# Patient Record
Sex: Female | Born: 1975 | Race: Black or African American | Hispanic: No | Marital: Single | Smoking: Never smoker
Health system: Southern US, Community
[De-identification: ages and names within clinical notes are randomized; demographics above are authoritative.]

## PROBLEM LIST (undated history)

## (undated) DIAGNOSIS — J45909 Unspecified asthma, uncomplicated: Secondary | ICD-10-CM

## (undated) DIAGNOSIS — I10 Essential (primary) hypertension: Secondary | ICD-10-CM

## (undated) HISTORY — PX: OTHER SURGICAL HISTORY: SHX169

## (undated) HISTORY — DX: Essential (primary) hypertension: I10

---

## 2000-07-21 ENCOUNTER — Other Ambulatory Visit: Admission: RE | Admit: 2000-07-21 | Discharge: 2000-07-21 | Payer: Self-pay | Admitting: Obstetrics and Gynecology

## 2001-09-08 ENCOUNTER — Other Ambulatory Visit: Admission: RE | Admit: 2001-09-08 | Discharge: 2001-09-08 | Payer: Self-pay | Admitting: Obstetrics and Gynecology

## 2014-02-03 ENCOUNTER — Ambulatory Visit: Payer: BC Managed Care – PPO

## 2014-02-03 ENCOUNTER — Ambulatory Visit (INDEPENDENT_AMBULATORY_CARE_PROVIDER_SITE_OTHER): Payer: BC Managed Care – PPO | Admitting: Internal Medicine

## 2014-02-03 VITALS — BP 152/104 | HR 52 | Temp 98.0°F | Resp 18 | Wt 211.0 lb

## 2014-02-03 DIAGNOSIS — I10 Essential (primary) hypertension: Secondary | ICD-10-CM

## 2014-02-03 DIAGNOSIS — R5383 Other fatigue: Secondary | ICD-10-CM

## 2014-02-03 DIAGNOSIS — R011 Cardiac murmur, unspecified: Secondary | ICD-10-CM

## 2014-02-03 DIAGNOSIS — R002 Palpitations: Secondary | ICD-10-CM

## 2014-02-03 DIAGNOSIS — R0602 Shortness of breath: Secondary | ICD-10-CM

## 2014-02-03 DIAGNOSIS — Z823 Family history of stroke: Secondary | ICD-10-CM

## 2014-02-03 DIAGNOSIS — Z8249 Family history of ischemic heart disease and other diseases of the circulatory system: Secondary | ICD-10-CM

## 2014-02-03 DIAGNOSIS — R5381 Other malaise: Secondary | ICD-10-CM

## 2014-02-03 DIAGNOSIS — R51 Headache: Secondary | ICD-10-CM

## 2014-02-03 DIAGNOSIS — R079 Chest pain, unspecified: Secondary | ICD-10-CM

## 2014-02-03 LAB — POCT CBC
GRANULOCYTE PERCENT: 48.8 % (ref 37–80)
HEMATOCRIT: 41.1 % (ref 37.7–47.9)
HEMOGLOBIN: 13.1 g/dL (ref 12.2–16.2)
Lymph, poc: 1.7 (ref 0.6–3.4)
MCH, POC: 28.6 pg (ref 27–31.2)
MCHC: 31.9 g/dL (ref 31.8–35.4)
MCV: 89.8 fL (ref 80–97)
MID (cbc): 0.3 (ref 0–0.9)
MPV: 9.7 fL (ref 0–99.8)
POC Granulocyte: 2 (ref 2–6.9)
POC LYMPH %: 43 % (ref 10–50)
POC MID %: 8.2 %M (ref 0–12)
Platelet Count, POC: 267 10*3/uL (ref 142–424)
RBC: 4.58 M/uL (ref 4.04–5.48)
RDW, POC: 15.3 %
WBC: 4 10*3/uL — AB (ref 4.6–10.2)

## 2014-02-03 LAB — POCT URINE PREGNANCY: Preg Test, Ur: NEGATIVE

## 2014-02-03 LAB — COMPREHENSIVE METABOLIC PANEL
ALT: 20 U/L (ref 0–35)
AST: 20 U/L (ref 0–37)
Albumin: 4.2 g/dL (ref 3.5–5.2)
Alkaline Phosphatase: 60 U/L (ref 39–117)
BILIRUBIN TOTAL: 0.8 mg/dL (ref 0.2–1.2)
BUN: 15 mg/dL (ref 6–23)
CO2: 28 mEq/L (ref 19–32)
CREATININE: 0.86 mg/dL (ref 0.50–1.10)
Calcium: 9.3 mg/dL (ref 8.4–10.5)
Chloride: 104 mEq/L (ref 96–112)
Glucose, Bld: 79 mg/dL (ref 70–99)
Potassium: 4 mEq/L (ref 3.5–5.3)
SODIUM: 139 meq/L (ref 135–145)
Total Protein: 7.2 g/dL (ref 6.0–8.3)

## 2014-02-03 LAB — POCT URINALYSIS DIPSTICK
BILIRUBIN UA: NEGATIVE
Blood, UA: NEGATIVE
Glucose, UA: NEGATIVE
KETONES UA: NEGATIVE
Leukocytes, UA: NEGATIVE
Nitrite, UA: NEGATIVE
PH UA: 6.5
Protein, UA: NEGATIVE
SPEC GRAV UA: 1.025
Urobilinogen, UA: 0.2

## 2014-02-03 LAB — POCT UA - MICROSCOPIC ONLY
BACTERIA, U MICROSCOPIC: NEGATIVE
Casts, Ur, LPF, POC: NEGATIVE
Crystals, Ur, HPF, POC: NEGATIVE
MUCUS UA: POSITIVE
Yeast, UA: NEGATIVE

## 2014-02-03 LAB — LIPID PANEL
Cholesterol: 120 mg/dL (ref 0–200)
HDL: 73 mg/dL (ref >39)
LDL Cholesterol: 37 mg/dL (ref 0–99)
TRIGLYCERIDES: 50 mg/dL (ref <150)
Total CHOL/HDL Ratio: 1.6 Ratio
VLDL: 10 mg/dL (ref 0–40)

## 2014-02-03 LAB — TSH: TSH: 1.841 u[IU]/mL (ref 0.350–4.500)

## 2014-02-03 MED ORDER — LISINOPRIL 10 MG PO TABS: 10.0000 mg | ORAL_TABLET | Freq: Every day | ORAL | Status: DC

## 2014-02-03 MED ORDER — METHOCARBAMOL 750 MG PO TABS: 750.0000 mg | ORAL_TABLET | Freq: Four times a day (QID) | ORAL | Status: DC

## 2014-02-03 NOTE — Patient Instructions (Addendum)
DASH Diet The DASH diet stands for "Dietary Approaches to Stop Hypertension." It is a healthy eating plan that has been shown to reduce high blood pressure (hypertension) in as little as 14 days, while also possibly providing other significant health benefits. These other health benefits include reducing the risk of breast cancer after menopause and reducing the risk of type 2 diabetes, heart disease, colon cancer, and stroke. Health benefits also include weight loss and slowing kidney failure in patients with chronic kidney disease.  DIET GUIDELINES  Limit salt (sodium). Your diet should contain less than 1500 mg of sodium daily.  Limit refined or processed carbohydrates. Your diet should include mostly whole grains. Desserts and added sugars should be used sparingly.  Include small amounts of heart-healthy fats. These types of fats include nuts, oils, and tub margarine. Limit saturated and trans fats. These fats have been shown to be harmful in the body. CHOOSING FOODS  The following food groups are based on a 2000 calorie diet. See your Registered Dietitian for individual calorie needs. Grains and Grain Products (6 to 8 servings daily)  Eat More Often: Whole-wheat bread, brown rice, whole-grain or wheat pasta, quinoa, popcorn without added fat or salt (air popped).  Eat Less Often: White bread, white pasta, white rice, cornbread. Vegetables (4 to 5 servings daily)  Eat More Often: Fresh, frozen, and canned vegetables. Vegetables may be raw, steamed, roasted, or grilled with a minimal amount of fat.  Eat Less Often/Avoid: Creamed or fried vegetables. Vegetables in a cheese sauce. Fruit (4 to 5 servings daily)  Eat More Often: All fresh, canned (in natural juice), or frozen fruits. Dried fruits without added sugar. One hundred percent fruit juice ( cup [237 mL] daily).  Eat Less Often: Dried fruits with added sugar. Canned fruit in light or heavy syrup. Lean Meats, Fish, and Poultry (2  servings or less daily. One serving is 3 to 4 oz [85-114 g]).  Eat More Often: Ninety percent or leaner ground beef, tenderloin, sirloin. Round cuts of beef, chicken breast, turkey breast. All fish. Grill, bake, or broil your meat. Nothing should be fried.  Eat Less Often/Avoid: Fatty cuts of meat, turkey, or chicken leg, thigh, or wing. Fried cuts of meat or fish. Dairy (2 to 3 servings)  Eat More Often: Low-fat or fat-free milk, low-fat plain or light yogurt, reduced-fat or part-skim cheese.  Eat Less Often/Avoid: Milk (whole, 2%).Whole milk yogurt. Full-fat cheeses. Nuts, Seeds, and Legumes (4 to 5 servings per week)  Eat More Often: All without added salt.  Eat Less Often/Avoid: Salted nuts and seeds, canned beans with added salt. Fats and Sweets (limited)  Eat More Often: Vegetable oils, tub margarines without trans fats, sugar-free gelatin. Mayonnaise and salad dressings.  Eat Less Often/Avoid: Coconut oils, palm oils, butter, stick margarine, cream, half and half, cookies, candy, pie. FOR MORE INFORMATION The Dash Diet Eating Plan: www.dashdiet.org Document Released: 09/19/2011 Document Revised: 12/23/2011 Document Reviewed: 09/19/2011 [employer] Patient Information 2014 [employer]. Hypertension As your heart beats, it forces blood through your arteries. This force is your blood pressure. If the pressure is too high, it is called hypertension (HTN) or high blood pressure. HTN is dangerous because you may have it and not know it. High blood pressure may mean that your heart has to work harder to pump blood. Your arteries may be narrow or stiff. The extra work puts you at risk for heart disease, stroke, and other problems.  Blood pressure consists of two numbers, a   higher number over a lower, 110/72, for example. It is stated as "110 over 72." The ideal is below 120 for the top number (systolic) and under 80 for the bottom (diastolic). Write down your blood pressure today. You  should pay close attention to your blood pressure if you have certain conditions such as:  Heart failure.  Prior heart attack.  Diabetes  Chronic kidney disease.  Prior stroke.  Multiple risk factors for heart disease. To see if you have HTN, your blood pressure should be measured while you are seated with your arm held at the level of the heart. It should be measured at least twice. A one-time elevated blood pressure reading (especially in the Emergency Department) does not mean that you need treatment. There may be conditions in which the blood pressure is different between your right and left arms. It is important to see your caregiver soon for a recheck. Most people have essential hypertension which means that there is not a specific cause. This type of high blood pressure may be lowered by changing lifestyle factors such as:  Stress.  Smoking.  Lack of exercise.  Excessive weight.  Drug/tobacco/alcohol use.  Eating less salt. Most people do not have symptoms from high blood pressure until it has caused damage to the body. Effective treatment can often prevent, delay or reduce that damage. TREATMENT  When a cause has been identified, treatment for high blood pressure is directed at the cause. There are a large number of medications to treat HTN. These fall into several categories, and your caregiver will help you select the medicines that are best for you. Medications may have side effects. You should review side effects with your caregiver. If your blood pressure stays high after you have made lifestyle changes or started on medicines,   Your medication(s) may need to be changed.  Other problems may need to be addressed.  Be certain you understand your prescriptions, and know how and when to take your medicine.  Be sure to follow up with your caregiver within the time frame advised (usually within two weeks) to have your blood pressure rechecked and to review your  medications.  If you are taking more than one medicine to lower your blood pressure, make sure you know how and at what times they should be taken. Taking two medicines at the same time can result in blood pressure that is too low. SEEK IMMEDIATE MEDICAL CARE IF:  You develop a severe headache, blurred or changing vision, or confusion.  You have unusual weakness or numbness, or a faint feeling.  You have severe chest or abdominal pain, vomiting, or breathing problems. MAKE SURE YOU:   Understand these instructions.  Will watch your condition.  Will get help right away if you are not doing well or get worse. Document Released: 09/30/2005 Document Revised: 12/23/2011 Document Reviewed: 05/20/2008 [facility] Patient Information 2014 [place], [place]. Thoracic Strain You have injured the muscles or tendons that attach to the upper part of your back behind your chest. This injury is called a thoracic strain, thoracic sprain, or mid-back strain.  CAUSES  The cause of thoracic strain varies. A less severe injury involves pulling a muscle or tendon without tearing it. A more severe injury involves tearing (rupturing) a muscle or tendon. With less severe injuries, there may be little loss of strength. Sometimes, there are breaks (fractures) in the bones to which the muscles are attached. These fractures are rare, unless there was a direct hit (trauma) or  you have weak bones due to osteoporosis or age. Longstanding strains may be caused by overuse or improper form during certain movements. Obesity can also increase your risk for back injuries. Sudden strains may occur due to injury or not warming up properly before exercise. Often, there is no obvious cause for a thoracic strain. SYMPTOMS  The main symptom is pain, especially with movement, such as during exercise. DIAGNOSIS  Your caregiver can usually tell what is wrong by taking an X-ray and doing a physical exam. TREATMENT   Physical therapy may  be helpful for recovery. Your caregiver can give you exercises to do or refer you to a physical therapist after your pain improves.  After your pain improves, strengthening and conditioning programs appropriate for your sport or occupation may be helpful.  Always warm up before physical activities or athletics. Stretching after physical activity may also help.  Certain over-the-counter medicines may also help. Ask your caregiver if there are medicines that would help you. If this is your first thoracic strain injury, proper care and proper healing time before starting activities should prevent long-term problems. Torn ligaments and tendons require as long to heal as broken bones. Average healing times may be only 1 week for a mild strain. For torn muscles and tendons, healing time may be up to 6 weeks to 2 months. HOME CARE INSTRUCTIONS   Apply ice to the injured area. Ice massages may also be used as directed.  Put ice in a plastic bag.  Place a towel between your skin and the bag.  Leave the ice on for 15-20 minutes, 03-04 times a day, for the first 2 days.  Only take over-the-counter or prescription medicines for pain, discomfort, or fever as directed by your caregiver.  Keep your appointments for physical therapy if this was prescribed.  Use wraps and back braces as instructed. SEEK IMMEDIATE MEDICAL CARE IF:   You have an increase in bruising, swelling, or pain.  Your pain has not improved with medicines.  You develop new shortness of breath, chest pain, or fever.  Problems seem to be getting worse rather than better. MAKE SURE YOU:   Understand these instructions.  Will watch your condition.  Will get help right away if you are not doing well or get worse. Document Released: 12/21/2003 Document Revised: 12/23/2011 Document Reviewed: 11/16/2010 [facility] Patient Information 2014 [place], [place]. Heart Murmur A heart murmur is an extra sound heard by your health care  provider when listening to your heart with a device called a stethoscope. The sound comes from turbulence when blood flows through the heart and may be a "hum" or "whoosh" sound heard when the heart beats. There are two types of heart murmurs:  Innocent murmurs Most people with this type of heart murmur do not have a heart problem. Many children have innocent heart murmurs. Your health care provider may suggest some basic testing to know whether your murmur is an innocent murmur. If an innocent heart murmur is found, there is no need for further tests or treatment and no need to restrict activities or stop playing sports.  Abnormal murmurs These types of murmurs can occur in children and adults. In children, abnormal heart murmurs are typically caused from heart defects that are present at birth (congenital). In adults, abnormal murmurs are usually from heart valve problems caused by disease, infection, or aging. CAUSES  All heart murmurs are a result of an issue with your heart valves. Normally, these valves open to let  blood flow through or out of your heart and then shut to keep it from flowing backward. If they do not work properly, you could have:  Regurgitation When blood leaks back through the valve in the wrong direction.  Mitral valve prolapse When the mitral valve of the heart has a loose flap and does not close tightly.  Stenosis When the valve does not open enough and blocks blood flow. SIGNS AND SYMPTOMS  Innocent murmurs do not cause symptoms, and many people with abnormal murmurs may or may not have symptoms. If symptoms do develop, they may include:  Shortness of breath.  Blue coloring of the skin, especially on the fingertips.  Chest pain.  Palpitations, or feeling a fluttering or skipped heartbeat.  Fainting.  Persistent cough.  Getting tired much faster than expected. DIAGNOSIS  A heart murmur might be heard during a sports physical or during any type of examination.  When a murmur is heard, it may suggest a possible problem. When this happens, your health care provider may ask you to see a heart specialist (cardiologist). You may also be asked to have one or more heart tests. In these cases, testing may vary depending on what your health care provider heard. Tests for a heart murmur may include:  Electrocardiogram.  Echocardiogram.  MRI. For children and adults who have an abnormal heart murmur and want to play sports, it is important to complete testing, review test results, and receive recommendations from your health care provider. If heart disease is present, it may not be safe to play. TREATMENT  Innocent murmurs require no treatment or activity restriction. If an abnormal murmur represents a problem with the heart, treatment will depend on the exact nature of the problem. In these cases, medicine or surgery may be needed to treat the problem. HOME CARE INSTRUCTIONS If you want to participate in sports or other types of strenuous physical activity, it is important to discuss this first with your health care provider. If the murmur represents a problem with the heart and you choose to participate in sports, there is a small chance that a serious problem (including sudden death) could result.  SEEK MEDICAL CARE IF:   You feel that your symptoms are slowly worsening.  You develop any new symptoms that cause concern.  You feel that you are having side effects from any medicines prescribed. SEEK IMMEDIATE MEDICAL CARE IF:   You develop chest pain.  You have shortness of breath.  You notice that your heart beats irregularly often enough to cause you to worry.  You have fainting spells.  Your symptoms suddenly get worse. Document Released: 11/07/2004 Document Revised: 07/21/2013 Document Reviewed: 06/07/2013 [facility] Patient Information 2014 [place], [place].

## 2014-02-03 NOTE — Progress Notes (Signed)
Subjective:    Patient ID: Jessica Moreno, female    DOB: 08/28/1976, 38 y.o.   MRN: [id number]  HPI  Back spasms x this morning, tight,painful, will spasm with movement, bent down to pick up lotion, and could not get back up  Hypertension-  hx of hypertension, HA, nausea, SOB, Palpitations, fatigue. Needs refill on BP medication, does not know the name of the medication she has tried. Hx of heart murmer Denies- blurred vision, vomiting, arm pain, chest pain , numbness, weakness, tingling  First tried a diuretic, but did not help. Has not taken BP meds x 5-6 months.  [place] 155-159/100 the past few days    Family history of heart attack  Family history of stroke  Review of Systems  Constitutional: Positive for fatigue.  Eyes: Negative.   Respiratory: Negative.   Cardiovascular: Positive for palpitations.  Gastrointestinal: Positive for nausea.  Musculoskeletal: Positive for back pain.  Allergic/Immunologic: Negative.   Neurological: Positive for weakness and headaches.  Psychiatric/Behavioral: Negative.        Objective:   Physical Exam  Constitutional: She is oriented to person, place, and time. She appears well-developed and well-nourished. No distress.  HENT:  Head: Normocephalic.  Mouth/Throat: Oropharynx is clear and moist.  Eyes: Conjunctivae and EOM are normal. Pupils are equal, round, and reactive to light.  Neck: Normal range of motion. Neck supple. No thyromegaly present.  Cardiovascular: Normal rate, regular rhythm and intact distal pulses.  Exam reveals no gallop and no friction rub.   Murmur heard. Pulmonary/Chest: Effort normal and breath sounds normal.  Abdominal: Soft. Bowel sounds are normal. There is no tenderness.  Musculoskeletal: She exhibits tenderness.       Thoracic back: She exhibits tenderness, bony tenderness, pain and spasm.  Lymphadenopathy:    She has no cervical adenopathy.  Neurological: She is alert and  oriented to person, place, and time. She exhibits normal muscle tone. Coordination normal.  Psychiatric: She has a normal mood and affect. Her behavior is normal. Judgment and thought content normal.    EKG sinus brady with 1st degree AV block  [facility] reading (PRIMARY) by  Dr Perrin MalteseGuest normal cxr  Results for orders placed in visit on 02/03/14  POCT CBC      Result Value Ref Range   WBC 4.0 (*) 4.6 - 10.2 K/uL   Lymph, poc 1.7  0.6 - 3.4   POC LYMPH PERCENT 43.0  10 - 50 %L   MID (cbc) 0.3  0 - 0.9   POC MID % 8.2  0 - 12 %M   POC Granulocyte 2.0  2 - 6.9   Granulocyte percent 48.8  37 - 80 %G   RBC 4.58  4.04 - 5.48 M/uL   Hemoglobin 13.1  12.2 - 16.2 g/dL   HCT, POC 40.941.1  81.137.7 - 47.9 %   MCV 89.8  80 - 97 fL   MCH, POC 28.6  27 - 31.2 pg   MCHC 31.9  31.8 - 35.4 g/dL   RDW, POC 91.415.3     Platelet Count, POC 267  142 - 424 K/uL   MPV 9.7  0 - 99.8 fL  POCT UA - MICROSCOPIC ONLY      Result Value Ref Range   WBC, Ur, HPF, POC 0-1     RBC, urine, microscopic 0-3     Bacteria, U Microscopic neg     Mucus, UA positive     Epithelial cells, urine per  micros 0-2     Crystals, Ur, HPF, POC neg     Casts, Ur, LPF, POC neg     Yeast, UA neg    POCT URINALYSIS DIPSTICK      Result Value Ref Range   Color, UA yellow     Clarity, UA clear     Glucose, UA neg     Bilirubin, UA neg     Ketones, UA neg     Spec Grav, UA 1.025     Blood, UA neg     pH, UA 6.5     Protein, UA neg     Urobilinogen, UA 0.2     Nitrite, UA neg     Leukocytes, UA Negative    POCT URINE PREGNANCY      Result Value Ref Range   Preg Test, Ur Negative             Assessment & Plan:  Thoracic spasms/Pain HTN uncontrolled 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

## 2014-02-04 ENCOUNTER — Encounter: Payer: Self-pay | Admitting: Family Medicine

## 2014-02-12 ENCOUNTER — Ambulatory Visit (INDEPENDENT_AMBULATORY_CARE_PROVIDER_SITE_OTHER): Payer: BC Managed Care – PPO | Admitting: Internal Medicine

## 2014-02-12 VITALS — BP 138/106 | HR 70 | Temp 97.4°F | Resp 15 | Ht 69.0 in | Wt 212.0 lb

## 2014-02-12 DIAGNOSIS — I10 Essential (primary) hypertension: Secondary | ICD-10-CM | POA: Insufficient documentation

## 2014-02-12 DIAGNOSIS — Z79899 Other long term (current) drug therapy: Secondary | ICD-10-CM

## 2014-02-12 MED ORDER — LISINOPRIL-HYDROCHLOROTHIAZIDE 20-12.5 MG PO TABS: 1.0000 | ORAL_TABLET | Freq: Every day | ORAL | Status: DC

## 2014-02-12 NOTE — Progress Notes (Signed)
   Subjective:    Patient ID: Jessica Moreno, female    DOB: 05/22/1976, 38 y.o.   MRN: [id number]  HPI 38 year old female pt presents for a re-check of hypertension. Pt was seen 4/23 and started on lisinopril 10 mg. She has been monitoring her BP at home and it continues to be high. She takes her BP in the morning and at night. It ranges from 160/106 and 165/106. Continues to have headaches and dizziness. Slight nausea. Slight fatigue. No SOB or chest pain. When pt triaged BP was 138/106. When checked again at 12:50 it was 148/102. Family hx of hypertension.    Review of Systems     Objective:   Physical Exam  Constitutional: She appears well-developed and well-nourished.  HENT:  Head: Normocephalic.  Eyes: EOM are normal.  Neck: Normal range of motion.  Cardiovascular: Normal rate.   Pulmonary/Chest: Effort normal.  Neurological: She is alert. She exhibits normal muscle tone. Coordination normal.  Psychiatric: She has a normal mood and affect.   142/102        Assessment & Plan:  Increase Lisinopril/htz 20/12.5 start today See cardiology

## 2014-02-12 NOTE — Patient Instructions (Signed)
Immunization Schedule, Adult  Influenza vaccine.  All adults should be immunized every year.  All adults, including pregnant women and people with hives-only allergy to eggs can receive the inactivated influenza (IIV) vaccine.  Adults aged 40 49 years can receive the recombinant influenza (RIV) vaccine. The RIV vaccine does not contain any egg protein.  Adults aged 15 years or older can receive the standard-dose IIV or the high-dose IIV.  Tetanus, diphtheria, and acellular pertussis (Td, Tdap) vaccine.  Pregnant women should receive 1 dose of Tdap vaccine during each pregnancy. The dose should be obtained regardless of the length of time since the last dose. Immunization is preferred during the 27th to 36th week of gestation.  An adult who has not previously received Tdap or who does not know his or her vaccine status should receive 1 dose of Tdap. This initial dose should be followed by tetanus and diphtheria toxoids (Td) booster doses every 10 years.  Adults with an unknown or incomplete history of completing a 3-dose immunization series with Td-containing vaccines should begin or complete a primary immunization series including a Tdap dose.  Adults should receive a Td booster every 10 years.  Varicella vaccine.  An adult without evidence of immunity to varicella should receive 2 doses or a second dose if he or she has previously received 1 dose.  Pregnant females who do not have evidence of immunity should receive the first dose after pregnancy. This first dose should be obtained before leaving the health care facility. The second dose should be obtained 4 8 weeks after the first dose.  Human papillomavirus (HPV) vaccine.  Females aged 39 26 years who have not received the vaccine previously should obtain the 3-dose series.  The vaccine is not recommended for use in pregnant females. However, pregnancy testing is not needed before receiving a dose. If a female is found to be  pregnant after receiving a dose, no treatment is needed. In that case, the remaining doses should be delayed until after the pregnancy.  Males aged 60 21 years who have not received the vaccine previously should receive the 3-dose series. Males aged 24 26 years may be immunized.  Immunization is recommended through the age of 4 years for any female who has sex with males and did not get any or all doses earlier.  Immunization is recommended for any person with an immunocompromised condition through the age of 72 years if he or she did not get any or all doses earlier.  During the 3-dose series, the second dose should be obtained 4 8 weeks after the first dose. The third dose should be obtained 24 weeks after the first dose and 16 weeks after the second dose.  Zoster vaccine.  One dose is recommended for adults aged 29 years or older unless certain conditions are present.  Measles, mumps, and rubella (MMR) vaccine.  Adults born before [birth year] generally are considered immune to measles and mumps.  Adults born in [birth year] or later should have 1 or more doses of MMR vaccine unless there is a contraindication to the vaccine or there is laboratory evidence of immunity to each of the three diseases.  A routine second dose of MMR vaccine should be obtained at least 28 days after the first dose for students attending postsecondary schools, health care workers, or international travelers.  People who received inactivated measles vaccine or an unknown type of measles vaccine during 1963 1967 should receive 2 doses of MMR vaccine.  People who received  inactivated mumps vaccine or an unknown type of mumps vaccine before 1979 and are at high risk for mumps infection should consider immunization with 2 doses of MMR vaccine.  For females of childbearing age, rubella immunity should be determined. If there is no evidence of immunity, females who are not pregnant should be vaccinated. If there is no evidence of  immunity, females who are pregnant should delay immunization until after pregnancy.  Unvaccinated health care workers born before [birth year] who lack laboratory evidence of measles, mumps, or rubella immunity or laboratory confirmation of disease should consider measles and mumps immunization with 2 doses of MMR vaccine or rubella immunization with 1 dose of MMR vaccine.  Pneumococcal 13-valent conjugate (PCV13) vaccine.  When indicated, a person who is uncertain of his or her immunization history and has no record of immunization should receive the PCV13 vaccine.  An adult aged 38 years or older who has certain medical conditions and has not been previously immunized should receive 1 dose of PCV13 vaccine. This PCV13 should be followed with a dose of pneumococcal polysaccharide (PPSV23) vaccine. The PPSV23 vaccine dose should be obtained at least 8 weeks after the dose of PCV13 vaccine.  An adult aged 33 years or older who has certain medical conditions and previously received 1 or more doses of PPSV23 vaccine should receive 1 dose of PCV13. The PCV13 vaccine dose should be obtained 1 or more years after the last PPSV23 vaccine dose.  Pneumococcal polysaccharide (PPSV23) vaccine.  When PCV13 is also indicated, PCV13 should be obtained first.  All adults aged 71 years and older should be immunized.  An adult younger than age 74 years who has certain medical conditions should be immunized.  Any person who resides in a nursing home or long-term care facility should be immunized.  An adult smoker should be immunized.  People with an immunocompromised condition and certain other conditions should receive both PCV13 and PPSV23 vaccines.  People with human immunodeficiency virus (HIV) infection should be immunized as soon as possible after diagnosis.  Immunization during chemotherapy or radiation therapy should be avoided.  Routine use of PPSV23 vaccine is not recommended for American Indians,  [place] Natives, or people younger than 65 years unless there are medical conditions that require PPSV23 vaccine.  When indicated, people who have unknown immunization and have no record of immunization should receive PPSV23 vaccine.  One-time revaccination 5 years after the first dose of PPSV23 is recommended for people aged 22 64 years who have chronic kidney failure, nephrotic syndrome, asplenia, or immunocompromised conditions.  People who received 1 2 doses of PPSV23 before age 69 years should receive another dose of PPSV23 vaccine at age 70 years or later if at least 5 years have passed since the previous dose.  Doses of PPSV23 are not needed for people immunized with PPSV23 at or after age 66 years.  Meningococcal vaccine.  Adults with asplenia or persistent complement component deficiencies should receive 2 doses of quadrivalent meningococcal conjugate (MenACWY-D) vaccine. The doses should be obtained at least 2 months apart.  Microbiologists working with certain meningococcal bacteria, [place] recruits, people at risk during an outbreak, and people who travel to or live in countries with a high rate of meningitis should be immunized.  A first-year college student up through age 74 years who is living in a residence hall should receive a dose if he or she did not receive a dose on or after his or her 16th birthday.  Adults who have  certain high-risk conditions should receive one or more doses of vaccine.  Hepatitis A vaccine.  Adults who wish to be protected from this disease, have certain high-risk conditions, work with hepatitis A-infected animals, work in hepatitis A research labs, or travel to or work in countries with a high rate of hepatitis A should be immunized.  Adults who were previously unvaccinated and who anticipate close contact with an international adoptee during the first 60 days after arrival in the [place] from a country with a high rate of hepatitis A should  be immunized.  Hepatitis B vaccine.  Adults who wish to be protected from this disease, have certain high-risk conditions, may be exposed to blood or other infectious body fluids, are household contacts or sex partners of hepatitis B positive people, are clients or workers in certain care facilities, or travel to or work in countries with a high rate of hepatitis B should be immunized.  Haemophilus influenzae type b (Hib) vaccine.  A previously unvaccinated person with asplenia or sickle cell disease or having a scheduled splenectomy should receive 1 dose of Hib vaccine.  Regardless of previous immunization, a recipient of a hematopoietic stem cell transplant should receive a 3-dose series 6 12 months after his or her successful transplant.  Hib vaccine is not recommended for adults with HIV infection. Document Released: 12/21/2003 Document Revised: 01/25/2013 Document Reviewed: 11/17/2012 [facility] Patient Information 2014 [place], [place].

## 2014-02-16 NOTE — Progress Notes (Signed)
pts number is incorrect in system.

## 2014-03-02 ENCOUNTER — Institutional Professional Consult (permissible substitution): Payer: Self-pay | Admitting: Cardiology

## 2014-04-13 ENCOUNTER — Institutional Professional Consult (permissible substitution): Payer: Self-pay | Admitting: Cardiology

## 2014-05-10 ENCOUNTER — Ambulatory Visit (INDEPENDENT_AMBULATORY_CARE_PROVIDER_SITE_OTHER): Payer: BC Managed Care – PPO

## 2014-05-10 ENCOUNTER — Ambulatory Visit (INDEPENDENT_AMBULATORY_CARE_PROVIDER_SITE_OTHER): Payer: BC Managed Care – PPO | Admitting: Family Medicine

## 2014-05-10 VITALS — BP 126/82 | HR 80 | Temp 98.2°F | Resp 18 | Ht 70.0 in | Wt 210.0 lb

## 2014-05-10 DIAGNOSIS — R062 Wheezing: Secondary | ICD-10-CM

## 2014-05-10 DIAGNOSIS — R059 Cough, unspecified: Secondary | ICD-10-CM

## 2014-05-10 DIAGNOSIS — R05 Cough: Secondary | ICD-10-CM

## 2014-05-10 LAB — POCT CBC
Granulocyte percent: 44.9 %G (ref 37–80)
HCT, POC: 40 % (ref 37.7–47.9)
Hemoglobin: 13.1 g/dL (ref 12.2–16.2)
Lymph, poc: 2.5 (ref 0.6–3.4)
MCH, POC: 29 pg (ref 27–31.2)
MCHC: 32.7 g/dL (ref 31.8–35.4)
MCV: 88.7 fL (ref 80–97)
MID (cbc): 0.5 (ref 0–0.9)
MPV: 7.2 fL (ref 0–99.8)
PLATELET COUNT, POC: 264 10*3/uL (ref 142–424)
POC Granulocyte: 2.4 (ref 2–6.9)
POC LYMPH %: 45.7 % (ref 10–50)
POC MID %: 9.4 % (ref 0–12)
RBC: 4.52 M/uL (ref 4.04–5.48)
RDW, POC: 15.2 %
WBC: 5.4 10*3/uL (ref 4.6–10.2)

## 2014-05-10 MED ORDER — IPRATROPIUM BROMIDE 0.02 % IN SOLN
0.5000 mg | Freq: Once | RESPIRATORY_TRACT | Status: AC
  Administered 2014-05-10: 0.5 mg via RESPIRATORY_TRACT

## 2014-05-10 MED ORDER — HYDROCOD POLST-CHLORPHEN POLST 10-8 MG/5ML PO LQCR: 5.0000 mL | Freq: Every evening | ORAL | Status: DC | PRN

## 2014-05-10 MED ORDER — PREDNISONE 20 MG PO TABS: ORAL_TABLET | ORAL | Status: DC

## 2014-05-10 MED ORDER — ALBUTEROL SULFATE HFA 108 (90 BASE) MCG/ACT IN AERS: 2.0000 | INHALATION_SPRAY | RESPIRATORY_TRACT | Status: DC | PRN

## 2014-05-10 MED ORDER — ALBUTEROL SULFATE (2.5 MG/3ML) 0.083% IN NEBU
2.5000 mg | INHALATION_SOLUTION | Freq: Once | RESPIRATORY_TRACT | Status: AC
  Administered 2014-05-10: 2.5 mg via RESPIRATORY_TRACT

## 2014-05-10 NOTE — Progress Notes (Signed)
Subjective:    Patient ID: Jessica Moreno, female    DOB: June 15, 1976, 38 y.o.   MRN: [id number]  HPI This is a very pleasant 38 yo female who presents today with a 4 week history of nonproductive cough that has gotten progressively worse. She was trying to exercise with the cough, but had to stop this week because the cough has gotten so bad. Her sleep has been disturbed the last several nights. She has taken OTC cough syrup with some relief.  Had childhood asthma which resolved. She has not used an inhaler since childhood.   She was started on lisinopril 02/03/14 and was changed to lisinopril-HCTZ 02/12/14. She thinks she has probably missed a dose here or there since her cough began, but does not recall any improvement in her cough.   Recently moved back to [place] from [place] and is living with her mother. She thinks there might be mold in the house. She does not have risk factors for TB.  Past Medical History  Diagnosis Date  . Hypertension    Past Surgical History  Procedure Laterality Date  . D & c     History reviewed. No pertinent family history. History  Substance Use Topics  . Smoking status: Never Smoker   . Smokeless tobacco: Not on file  . Alcohol Use: Yes     Review of Systems No fever, no runny nose, chest hurts from coughing, +wheezing at night, no SOB, no myalgias.    Objective:   Physical Exam  Vitals reviewed. Constitutional: She is oriented to person, place, and time. She appears well-developed and well-nourished. No distress.  Patient with frequent, deep cough.  HENT:  Head: Normocephalic and atraumatic.  Right Ear: Tympanic membrane, external ear and ear canal normal.  Left Ear: Tympanic membrane, external ear and ear canal normal.  Nose: Nose normal.  Mouth/Throat: Oropharynx is clear and moist.  Eyes: Conjunctivae and EOM are normal. Right eye exhibits no discharge. Left eye exhibits no discharge.  Neck: Normal range of motion. Neck supple.    Cardiovascular: Normal rate, regular rhythm and normal heart sounds.   Pulmonary/Chest: Effort normal. No respiratory distress. Wheezes: few faint expiratory wheezes posteriorly. She has no rales. She exhibits no tenderness.  Musculoskeletal: Normal range of motion.  Neurological: She is alert and oriented to person, place, and time.  Skin: Skin is warm and dry. She is not diaphoretic.  Psychiatric: She has a normal mood and affect. Her behavior is normal. Judgment and thought content normal.   Results for orders placed in visit on 05/10/14  POCT CBC      Result Value Ref Range   WBC 5.4  4.6 - 10.2 K/uL   Lymph, poc 2.5  0.6 - 3.4   POC LYMPH PERCENT 45.7  10 - 50 %L   MID (cbc) 0.5  0 - 0.9   POC MID % 9.4  0 - 12 %M   POC Granulocyte 2.4  2 - 6.9   Granulocyte percent 44.9  37 - 80 %G   RBC 4.52  4.04 - 5.48 M/uL   Hemoglobin 13.1  12.2 - 16.2 g/dL   HCT, POC 40.9  81.1 - 47.9 %   MCV 88.7  80 - 97 fL   MCH, POC 29.0  27 - 31.2 pg   MCHC 32.7  31.8 - 35.4 g/dL   RDW, POC 91.4     Platelet Count, POC 264  142 - 424 K/uL   MPV 7.2  0 - 99.8 fL      [facility] reading (PRIMARY) by  Dr. Clelia CroftShaw- normal chest Xray.  Patient given atrovent/albuterol neb in office with some loosening of cough and subjective improvement     Assessment & Plan:  Discussed with Dr. Clelia CroftShaw 1. Wheezing - ipratropium (ATROVENT) nebulizer solution 0.5 mg; Take 2.5 mLs (0.5 mg total) by nebulization once. - albuterol (PROVENTIL) (2.5 MG/3ML) 0.083% nebulizer solution 2.5 mg; Take 3 mLs (2.5 mg total) by nebulization once. - DG Chest 2 View; Future - POCT CBC - predniSONE (DELTASONE) 20 MG tablet; Take 3 tablets for 3 days, 2 tablets for 3 days, 1 tablet for 3 days  Dispense: 18 tablet; Refill: 0 - albuterol (PROVENTIL HFA;VENTOLIN HFA) 108 (90 BASE) MCG/ACT inhaler; Inhale 2 puffs into the lungs every 4 (four) hours as needed for wheezing or shortness of breath (cough, shortness of breath or wheezing.).   Dispense: 1 Inhaler; Refill: 1  2. Cough - chlorpheniramine-HYDROcodone (TUSSIONEX PENNKINETIC ER) 10-8 MG/5ML LQCR; Take 5 mLs by mouth at bedtime as needed for cough (cough).  Dispense: 70 mL; Refill: 0  -Follow up in 1 week, sooner if worsening or no improvement with above measures   Emi Belfasteborah B. Gessner, FNP-BC  [facility], [facility]  05/10/2014 6:45 PM

## 2014-05-11 NOTE — Progress Notes (Signed)
Reviewed documentation and xray and agree w/ assessment and plan. Aadon Gorelik, MD MPH   

## 2014-05-16 ENCOUNTER — Ambulatory Visit (INDEPENDENT_AMBULATORY_CARE_PROVIDER_SITE_OTHER): Payer: BC Managed Care – PPO | Admitting: Family Medicine

## 2014-05-16 VITALS — BP 160/96 | HR 69 | Temp 97.8°F | Resp 20 | Ht 69.5 in | Wt 212.6 lb

## 2014-05-16 DIAGNOSIS — R05 Cough: Secondary | ICD-10-CM

## 2014-05-16 DIAGNOSIS — R059 Cough, unspecified: Secondary | ICD-10-CM

## 2014-05-16 DIAGNOSIS — J069 Acute upper respiratory infection, unspecified: Secondary | ICD-10-CM

## 2014-05-16 MED ORDER — ALBUTEROL SULFATE (2.5 MG/3ML) 0.083% IN NEBU
2.5000 mg | INHALATION_SOLUTION | Freq: Once | RESPIRATORY_TRACT | Status: AC
  Administered 2014-05-16: 2.5 mg via RESPIRATORY_TRACT

## 2014-05-16 MED ORDER — E-Z SPACER DEVI: Status: DC

## 2014-05-16 MED ORDER — AZITHROMYCIN 250 MG PO TABS: ORAL_TABLET | ORAL | Status: DC

## 2014-05-16 NOTE — Progress Notes (Signed)
   Subjective:    Patient ID: Jessica Moreno, female    DOB: 02/22/1976, 38 y.o.   MRN: [id number]  HPI Patient presents today for follow up of cough. Tolerated prednisone. Got some relief with inhaler. Used about 3 hours ago last. Cough syrup kept cough at bay through the night. Friday and Saturday had wheezing. Had one episode of coughing spell with severe coughing and had an episode of hemoptysis mixed with yellow sputum. Feels like she is 85% better, but is still coughing. Sputum is thick yellow.    Has not been taking bp medicine for almost a week- left it at work and has been off.   Review of Systems No fever, SOB with cough, fatigue improved    Objective:   Physical Exam  Vitals reviewed. Constitutional: She is oriented to person, place, and time. She appears well-developed and well-nourished.  HENT:  Head: Normocephalic and atraumatic.  Neck: Normal range of motion. Neck supple.  Cardiovascular: Normal rate and regular rhythm.   Pulmonary/Chest: Effort normal and breath sounds normal. No respiratory distress. She has no wheezes. She has no rales.  She sounds a little tight and is still coughing considerably in the exam room.  Musculoskeletal: Normal range of motion.  Neurological: She is alert and oriented to person, place, and time.  Skin: Skin is warm and dry.  Psychiatric: She has a normal mood and affect. Her behavior is normal. Judgment and thought content normal.   Pre- neb PF- 440, this = 88% expected (expected 495) , post-neb PF- 450 this = 91% expected     Assessment & Plan:  Discussed with Dr. Clelia CroftShaw  1. Cough - albuterol (PROVENTIL) (2.5 MG/3ML) 0.083% nebulizer solution 2.5 mg; Take 3 mLs (2.5 mg total) by nebulization once. - would have expected more resolution of cough at this point, although patient reports she feels 85% better. 2. Acute upper respiratory infections of unspecified site - azithromycin (ZITHROMAX) 250 MG tablet; Take 2 tablets first day then  1 tablet a day for 4 days.  Dispense: 6 tablet; Refill: 0 - Spacer/Aero-Holding Chambers (E-Z SPACER) inhaler; Use as instructed  Dispense: 1 each; Refill: 2 - Follow up if any worsening or if not completely better in 7-10 days.  Emi Belfasteborah B. Ermon Sagan, FNP-BC  [facility]  05/17/2014 8:19 PM

## 2014-07-08 ENCOUNTER — Encounter: Payer: Self-pay | Admitting: Cardiology

## 2015-01-12 ENCOUNTER — Encounter: Payer: Self-pay | Admitting: Family Medicine

## 2015-01-12 ENCOUNTER — Ambulatory Visit (INDEPENDENT_AMBULATORY_CARE_PROVIDER_SITE_OTHER): Payer: BLUE CROSS/BLUE SHIELD | Admitting: Family Medicine

## 2015-01-12 VITALS — BP 150/94 | HR 66 | Temp 98.0°F | Resp 16 | Ht 70.5 in | Wt 220.4 lb

## 2015-01-12 DIAGNOSIS — E669 Obesity, unspecified: Secondary | ICD-10-CM

## 2015-01-12 DIAGNOSIS — E66811 Obesity, class 1: Secondary | ICD-10-CM | POA: Insufficient documentation

## 2015-01-12 DIAGNOSIS — Z8249 Family history of ischemic heart disease and other diseases of the circulatory system: Secondary | ICD-10-CM | POA: Diagnosis not present

## 2015-01-12 DIAGNOSIS — I10 Essential (primary) hypertension: Secondary | ICD-10-CM | POA: Diagnosis not present

## 2015-01-12 DIAGNOSIS — R010 Benign and innocent cardiac murmurs: Secondary | ICD-10-CM | POA: Diagnosis not present

## 2015-01-12 DIAGNOSIS — R011 Cardiac murmur, unspecified: Secondary | ICD-10-CM

## 2015-01-12 LAB — BASIC METABOLIC PANEL
BUN: 18 mg/dL (ref 6–23)
CO2: 28 mEq/L (ref 19–32)
Calcium: 8.9 mg/dL (ref 8.4–10.5)
Chloride: 106 mEq/L (ref 96–112)
Creat: 1.09 mg/dL (ref 0.50–1.10)
GLUCOSE: 88 mg/dL (ref 70–99)
POTASSIUM: 4 meq/L (ref 3.5–5.3)
Sodium: 139 mEq/L (ref 135–145)

## 2015-01-12 MED ORDER — LISINOPRIL-HYDROCHLOROTHIAZIDE 20-12.5 MG PO TABS: 1.0000 | ORAL_TABLET | Freq: Every day | ORAL | Status: DC

## 2015-01-12 NOTE — Progress Notes (Signed)
Subjective:    Patient ID: Jessica Moreno, female    DOB: Dec 27, 1975, 39 y.o.   MRN: [id number]  HPI  This 39 y.o. Female has HTN > 15 years. Previous medication worked well but she ran out and did not get it refilled. She was seen by Dr. Perrin Maltese in May 2015; he prescribed 1 year of medication and ordered cardiology referral to evaluate murmur, family hx of MI/stroke. Pt did not have this evaluation. She feels well and exercises almost daily, reporting some mild DOE when trying to jog. Pt recently relocated to [facility] from [place]; she is currently living w/ her mother.  Patient Active Problem List   Diagnosis Date Noted  . Obesity (BMI 30.0-34.9) 01/12/2015  . Family history of hypertension 01/12/2015  . Family history of cardiovascular disease 01/12/2015  . HTN (hypertension) 02/12/2014    Prior to Admission medications   Medication Sig Start Date End Date Taking? Authorizing Provider  albuterol (PROVENTIL HFA;VENTOLIN HFA) 108 (90 BASE) MCG/ACT inhaler Inhale 2 puffs into the lungs every 4 (four) hours as needed for wheezing or shortness of breath (cough, shortness of breath or wheezing.). Patient not taking: Reported on 01/12/2015 05/10/14  No Emi Belfast, FNP    History   Social History  . Marital Status: Single    Spouse Name: N/A  . Number of Children: N/A  . Years of Education: N/A   Occupational History  . Works in [employer]   Social History Main Topics  . Smoking status: Never Smoker   . Smokeless tobacco: Not on file  . Alcohol Use: Yes  . Drug Use: No  . Sexual Activity: Not on file   Other Topics Concern  . Not on file   Social History Narrative   FAMILY Hx + for HTN, heart disease and CVA.   Review of Systems  Constitutional: Negative.   HENT: Negative.   Eyes: Negative.        Wears corrective lenses.  Respiratory: Negative.   Cardiovascular: Negative.   Endocrine: Negative.   Musculoskeletal: Negative.   Neurological: Negative.     Psychiatric/Behavioral: Negative.        Objective:   Physical Exam  Constitutional: She is oriented to person, place, and time. She appears well-developed and well-nourished. No distress.  Blood pressure 150/94, pulse 66, temperature 98 F (36.7 C), temperature source Oral, resp. rate 16, height 5' 10.5" (1.791 m), weight 220 lb 6.4 oz (99.973 kg), last menstrual period 12/17/2014, SpO2 98 %.   HENT:  Head: Normocephalic and atraumatic.  Right Ear: External ear normal.  Left Ear: External ear normal.  Nose: Nose normal.  Mouth/Throat: Oropharynx is clear and moist.  Eyes: Conjunctivae and EOM are normal. No scleral icterus.  Neck: Normal range of motion. Neck supple. No thyromegaly present.  Cardiovascular: Normal rate, regular rhythm, S1 normal and S2 normal.   No extrasystoles are present. PMI is not displaced.  Exam reveals no gallop.   Murmur heard.  Crescendo systolic murmur is present with a grade of 3/6  Murmur heard at LSB and RUSB; accentuated S2 at RUSB.  Pulmonary/Chest: Effort normal and breath sounds normal. No respiratory distress. She has no decreased breath sounds. She has no wheezes.  Abdominal: Soft. Bowel sounds are normal. She exhibits no distension and no mass. There is no tenderness. There is no guarding.  Musculoskeletal: Normal range of motion. She exhibits no edema or tenderness.  Neurological: She is alert and oriented to person, place, and time. No  cranial nerve deficit. Coordination normal.  Skin: Skin is warm and dry. She is not diaphoretic. No erythema.  Psychiatric: She has a normal mood and affect. Her behavior is normal. Judgment and thought content normal.  Vitals reviewed.      Assessment & Plan:  Essential hypertension - Resume Lisinopril- HCTZ  20- 12.5 mg 1 tab daily. Plan: 2D Echocardiogram without contrast, Basic metabolic panel, Vitamin D, 25-hydroxy, Thyroid Panel With TSH  Undiagnosed cardiac murmurs - Plan: 2D Echocardiogram without  contrast, Thyroid Panel With TSH  Obesity (BMI 30.0-34.9)- Continue healthy lifestyle improvements.  Family history of HTN  Meds ordered this encounter  Medications  . lisinopril-hydrochlorothiazide (ZESTORETIC) 20-12.5 MG per tablet    Sig: Take 1 tablet by mouth daily.    Dispense:  90 tablet    Refill:  3

## 2015-01-12 NOTE — Patient Instructions (Signed)
Hypertension Hypertension, commonly called high blood pressure, is when the force of blood pumping through your arteries is too strong. Your arteries are the blood vessels that carry blood from your heart throughout your body. A blood pressure reading consists of a higher number over a lower number, such as 110/72. The higher number (systolic) is the pressure inside your arteries when your heart pumps. The lower number (diastolic) is the pressure inside your arteries when your heart relaxes. Ideally you want your blood pressure below 120/80. Hypertension forces your heart to work harder to pump blood. Your arteries may become narrow or stiff. Having hypertension puts you at risk for heart disease, stroke, and other problems.  RISK FACTORS Some risk factors for high blood pressure are controllable. Others are not.  Risk factors you cannot control include:   Race. You may be at higher risk if you are African American.  Age. Risk increases with age.  Gender. Men are at higher risk than women before age 45 years. After age 65, women are at higher risk than men. Risk factors you can control include:  Not getting enough exercise or physical activity.  Being overweight.  Getting too much fat, sugar, calories, or salt in your diet.  Drinking too much alcohol. SIGNS AND SYMPTOMS Hypertension does not usually cause signs or symptoms. Extremely high blood pressure (hypertensive crisis) may cause headache, anxiety, shortness of breath, and nosebleed. DIAGNOSIS  To check if you have hypertension, your health care provider will measure your blood pressure while you are seated, with your arm held at the level of your heart. It should be measured at least twice using the same arm. Certain conditions can cause a difference in blood pressure between your right and left arms. A blood pressure reading that is higher than normal on one occasion does not mean that you need treatment. If one blood pressure reading  is high, ask your health care provider about having it checked again. TREATMENT  Treating high blood pressure includes making lifestyle changes and possibly taking medicine. Living a healthy lifestyle can help lower high blood pressure. You may need to change some of your habits. Lifestyle changes may include:  Following the DASH diet. This diet is high in fruits, vegetables, and whole grains. It is low in salt, red meat, and added sugars.  Getting at least 2 hours of brisk physical activity every week.  Losing weight if necessary.  Not smoking.  Limiting alcoholic beverages.  Learning ways to reduce stress. If lifestyle changes are not enough to get your blood pressure under control, your health care provider may prescribe medicine. You may need to take more than one. Work closely with your health care provider to understand the risks and benefits. HOME CARE INSTRUCTIONS  Have your blood pressure rechecked as directed by your health care provider.   Take medicines only as directed by your health care provider. Follow the directions carefully. Blood pressure medicines must be taken as prescribed. The medicine does not work as well when you skip doses. Skipping doses also puts you at risk for problems.   Do not smoke.   Monitor your blood pressure at home as directed by your health care provider. SEEK MEDICAL CARE IF:   You think you are having a reaction to medicines taken.  You have recurrent headaches or feel dizzy.  You have swelling in your ankles.  You have trouble with your vision. SEEK IMMEDIATE MEDICAL CARE IF:  You develop a severe headache or confusion.    You have unusual weakness, numbness, or feel faint.  You have severe chest or abdominal pain.  You vomit repeatedly.  You have trouble breathing. MAKE SURE YOU:   Understand these instructions.  Will watch your condition.  Will get help right away if you are not doing well or get worse. Document  Released: 09/30/2005 Document Revised: 02/14/2014 Document Reviewed: 07/23/2013 Exit[employer] Patient Information 2015 [employer]. This information is not intended to replace advice given to you by your health care provider. Make sure you discuss any questions you have with your health care provider.        Mediterranean Diet  Why follow it? Research shows. . Those who follow the Mediterranean diet have a reduced risk of heart disease  . The diet is associated with a reduced incidence of Parkinson's and Alzheimer's diseases . People following the diet may have longer life expectancies and lower rates of chronic diseases  . The Dietary Guidelines for Americans recommends the Mediterranean diet as an eating plan to promote health and prevent disease  What Is the Mediterranean Diet?  . Healthy eating plan based on typical foods and recipes of Mediterranean-style cooking . The diet is primarily a plant based diet; these foods should make up a majority of meals   Starches - Plant based foods should make up a majority of meals - They are an important sources of vitamins, minerals, energy, antioxidants, and fiber - Choose whole grains, foods high in fiber and minimally processed items  - Typical grain sources include wheat, oats, barley, corn, brown rice, bulgar, farro, millet, polenta, couscous  - Various types of beans include chickpeas, lentils, fava beans, black beans, white beans   Fruits  Veggies - Large quantities of antioxidant rich fruits & veggies; 6 or more servings  - Vegetables can be eaten raw or lightly drizzled with oil and cooked  - Vegetables common to the traditional Mediterranean Diet include: artichokes, arugula, beets, broccoli, brussel sprouts, cabbage, carrots, celery, collard greens, cucumbers, eggplant, kale, leeks, lemons, lettuce, mushrooms, okra, onions, peas, peppers, potatoes, pumpkin, radishes, rutabaga, shallots, spinach, sweet potatoes, turnips, zucchini - Fruits  common to the Mediterranean Diet include: apples, apricots, avocados, cherries, clementines, dates, figs, grapefruits, grapes, melons, nectarines, oranges, peaches, pears, pomegranates, strawberries, tangerines  Fats - Replace butter and margarine with healthy oils, such as olive oil, canola oil, and tahini  - Limit nuts to no more than a handful a day  - Nuts include walnuts, almonds, pecans, pistachios, pine nuts  - Limit or avoid candied, honey roasted or heavily salted nuts - Olives are central to the Mediterranean diet - can be eaten whole or used in a variety of dishes   Meats Protein - Limiting red meat: no more than a few times a month - When eating red meat: choose lean cuts and keep the portion to the size of deck of cards - Eggs: approx. 0 to 4 times a week  - Fish and lean poultry: at least 2 a week  - Healthy protein sources include, chicken, turkey, lean beef, lamb - Increase intake of seafood such as tuna, salmon, trout, mackerel, shrimp, scallops - Avoid or limit high fat processed meats such as sausage and bacon  Dairy - Include moderate amounts of low fat dairy products  - Focus on healthy dairy such as fat free yogurt, skim milk, low or reduced fat cheese - Limit dairy products higher in fat such as whole or 2% milk, cheese, ice cream  Alcohol -   Moderate amounts of red wine is ok  - No more than 5 oz daily for women (all ages) and men older than age 65  - No more than 10 oz of wine daily for men younger than 65  Other - Limit sweets and other desserts  - Use herbs and spices instead of salt to flavor foods  - Herbs and spices common to the traditional Mediterranean Diet include: basil, bay leaves, chives, cloves, cumin, fennel, garlic, lavender, marjoram, mint, oregano, parsley, pepper, rosemary, sage, savory, sumac, tarragon, thyme   It's not just a diet, it's a lifestyle:  . The Mediterranean diet includes lifestyle factors typical of those in the region  . Foods, drinks  and meals are best eaten with others and savored . Daily physical activity is important for overall good health . This could be strenuous exercise like running and aerobics . This could also be more leisurely activities such as walking, housework, yard-work, or taking the stairs . Moderation is the key; a balanced and healthy diet accommodates most foods and drinks . Consider portion sizes and frequency of consumption of certain foods   Meal Ideas & Options:  . Breakfast:  o Whole wheat toast or whole wheat English muffins with peanut butter & hard boiled egg o Steel cut oats topped with apples & cinnamon and skim milk  o Fresh fruit: banana, strawberries, melon, berries, peaches  o Smoothies: strawberries, bananas, greek yogurt, peanut butter o Low fat greek yogurt with blueberries and granola  o Egg white omelet with spinach and mushrooms o Breakfast couscous: whole wheat couscous, apricots, skim milk, cranberries  . Sandwiches:  o Hummus and grilled vegetables (peppers, zucchini, squash) on whole wheat bread   o Grilled chicken on whole wheat pita with lettuce, tomatoes, cucumbers or tzatziki  o Tuna salad on whole wheat bread: tuna salad made with greek yogurt, olives, red peppers, capers, green onions o Garlic rosemary lamb pita: lamb sauted with garlic, rosemary, salt & pepper; add lettuce, cucumber, greek yogurt to pita - flavor with lemon juice and black pepper  . Seafood:  o Mediterranean grilled salmon, seasoned with garlic, basil, parsley, lemon juice and black pepper o Shrimp, lemon, and spinach whole-grain pasta salad made with low fat greek yogurt  o Seared scallops with lemon orzo  o Seared tuna steaks seasoned salt, pepper, coriander topped with tomato mixture of olives, tomatoes, olive oil, minced garlic, parsley, green onions and cappers  . Meats:  o Herbed greek chicken salad with kalamata olives, cucumber, feta  o Red bell peppers stuffed with spinach, bulgur, lean  ground beef (or lentils) & topped with feta   o Kebabs: skewers of chicken, tomatoes, onions, zucchini, squash  o Turkey burgers: made with red onions, mint, dill, lemon juice, feta cheese topped with roasted red peppers . Vegetarian o Cucumber salad: cucumbers, artichoke hearts, celery, red onion, feta cheese, tossed in olive oil & lemon juice  o Hummus and whole grain pita points with a greek salad (lettuce, tomato, feta, olives, cucumbers, red onion) o Lentil soup with celery, carrots made with vegetable broth, garlic, salt and pepper  o Tabouli salad: parsley, bulgur, mint, scallions, cucumbers, tomato, radishes, lemon juice, olive oil, salt and pepper. o  

## 2015-01-13 LAB — THYROID PANEL WITH TSH
FREE THYROXINE INDEX: 1.9 (ref 1.4–3.8)
T3 UPTAKE: 27 % (ref 22–35)
T4, Total: 7 ug/dL (ref 4.5–12.0)
TSH: 1.706 u[IU]/mL (ref 0.350–4.500)

## 2015-01-13 LAB — VITAMIN D 25 HYDROXY (VIT D DEFICIENCY, FRACTURES): Vit D, 25-Hydroxy: 26 ng/mL — ABNORMAL LOW (ref 30–100)

## 2015-01-14 NOTE — Progress Notes (Signed)
Quick Note:  Please advise pt regarding following labs...  Vitamin D is a little low; take over-the-counter Vitamin D 2000 units 1 capsule daily. Try to get 10-15 minutes of sun exposure most days of the week. Kidney and thyroid functions are normal.  Copy of results to pt. ______

## 2015-02-03 ENCOUNTER — Ambulatory Visit (HOSPITAL_COMMUNITY): Payer: BLUE CROSS/BLUE SHIELD | Attending: Cardiology | Admitting: Radiology

## 2015-02-03 DIAGNOSIS — I10 Essential (primary) hypertension: Secondary | ICD-10-CM | POA: Diagnosis not present

## 2015-02-03 DIAGNOSIS — E669 Obesity, unspecified: Secondary | ICD-10-CM | POA: Diagnosis not present

## 2015-02-03 DIAGNOSIS — R011 Cardiac murmur, unspecified: Secondary | ICD-10-CM | POA: Diagnosis not present

## 2015-02-03 NOTE — Progress Notes (Signed)
Echocardiogram performed.  

## 2015-02-04 NOTE — Progress Notes (Signed)
Quick Note:  Your recent imaging study of the heart (echocardiogram) shows that your heart function is good. There are very minor valve abnormalities that do not require any further follow-up at this time. The main issue is blood pressure control. Stay on your medication and schedule a follow-up with one of the physician assistants in the next 3-4 months. If you have any questions, let me know. ______

## 2016-03-22 ENCOUNTER — Telehealth: Payer: Self-pay

## 2016-03-22 MED ORDER — LISINOPRIL-HYDROCHLOROTHIAZIDE 20-12.5 MG PO TABS: 1.0000 | ORAL_TABLET | Freq: Every day | ORAL | Status: DC

## 2016-03-22 NOTE — Telephone Encounter (Signed)
We have not gotten any requests from the pharmacy. Pt has appt on 6/13, and I sent in #7 to cover her until appt. Pt aware.

## 2016-03-22 NOTE — Telephone Encounter (Signed)
Pt wanting a refill on her lisinopril-hydrochlorothiazide (ZESTORETIC) 20-12.5 MG per tablet [[id number]][[id number]] Pharmacy:  [id number] - [place] ([place]), [place] - [address]. She states she has called her pharmacist several times. CB # [phone]

## 2016-03-25 NOTE — Telephone Encounter (Signed)
Pt LM on my VM asking if I had gotten reqs for 2 week extension on medication. I called her back and had to LM. I advised that the #7 had been sent in on the 9th to cover her until after her appt 6/13, which is still on the schedule, so I am confused as to why she needs more now. Asked her to CB.

## 2016-03-26 ENCOUNTER — Encounter: Payer: Self-pay | Admitting: Family Medicine

## 2016-03-26 ENCOUNTER — Ambulatory Visit (INDEPENDENT_AMBULATORY_CARE_PROVIDER_SITE_OTHER): Payer: BLUE CROSS/BLUE SHIELD | Admitting: Family Medicine

## 2016-03-26 VITALS — BP 115/71 | HR 71 | Temp 98.4°F | Resp 16 | Ht 71.0 in | Wt 201.0 lb

## 2016-03-26 DIAGNOSIS — R062 Wheezing: Secondary | ICD-10-CM

## 2016-03-26 DIAGNOSIS — E559 Vitamin D deficiency, unspecified: Secondary | ICD-10-CM | POA: Diagnosis not present

## 2016-03-26 DIAGNOSIS — I10 Essential (primary) hypertension: Secondary | ICD-10-CM

## 2016-03-26 LAB — BASIC METABOLIC PANEL
BUN: 22 mg/dL (ref 7–25)
CHLORIDE: 99 mmol/L (ref 98–110)
CO2: 28 mmol/L (ref 20–31)
Calcium: 9.1 mg/dL (ref 8.6–10.2)
Creat: 1.05 mg/dL (ref 0.50–1.10)
Glucose, Bld: 85 mg/dL (ref 65–99)
POTASSIUM: 3.7 mmol/L (ref 3.5–5.3)
SODIUM: 137 mmol/L (ref 135–146)

## 2016-03-26 MED ORDER — ALBUTEROL SULFATE HFA 108 (90 BASE) MCG/ACT IN AERS: 2.0000 | INHALATION_SPRAY | RESPIRATORY_TRACT | Status: DC | PRN

## 2016-03-26 MED ORDER — LISINOPRIL-HYDROCHLOROTHIAZIDE 20-12.5 MG PO TABS: 1.0000 | ORAL_TABLET | Freq: Every day | ORAL | Status: DC

## 2016-03-26 NOTE — Progress Notes (Signed)
   Subjective:    Patient ID: Jessica FillerDominique Morlock, female    DOB: 11/12/1975, 40 y.o.   MRN: [id number]  HPI This is a 40 yo female who presents today for follow up of HTN. She has been doing well. She has been exercising more regularly and watching diet. No medication side effects. Plays kickball and beach volleyball. No chest pain, no edema. Has some cough and wheezing when exposed to cigarette smoke, requests refill of inhaler.   Had recent wellness check at work, had good cholesterol levels, Normal blood sugar. Sees gyn annually. Has mammo at gyn office. Not currently sexually active, uses condoms when she is.   Past Medical History  Diagnosis Date  . Hypertension    Past Surgical History  Procedure Laterality Date  . D & c     No family history on file. Social History  Substance Use Topics  . Smoking status: Never Smoker   . Smokeless tobacco: None  . Alcohol Use: Yes      Review of Systems No chest pain, no SOB, no palpitations, no edema    Objective:   Physical Exam Physical Exam  Constitutional: Oriented to person, place, and time. She appears well-developed and well-nourished.  HENT:  Head: Normocephalic and atraumatic.  Eyes: Conjunctivae are normal.  Neck: Normal range of motion. Neck supple.  Cardiovascular: Normal rate, regular rhythm and normal heart sounds.   Pulmonary/Chest: Effort normal and breath sounds normal.  Musculoskeletal: Normal range of motion.  Neurological: Alert and oriented to person, place, and time.  Skin: Skin is warm and dry.  Psychiatric: Normal mood and affect. Behavior is normal. Judgment and thought content normal.  Vitals reviewed.  BP 115/71 mmHg  Pulse 71  Temp(Src) 98.4 F (36.9 C) (Oral)  Resp 16  Ht 5\' 11"  (1.803 m)  Wt 201 lb (91.173 kg)  BMI 28.05 kg/m2  LMP 03/18/2016 Wt Readings from Last 3 Encounters:  03/26/16 201 lb (91.173 kg)  01/12/15 220 lb 6.4 oz (99.973 kg)  05/16/14 212 lb 9.6 oz (96.435 kg)         Assessment & Plan:  1. Wheezing - albuterol (PROVENTIL HFA;VENTOLIN HFA) 108 (90 Base) MCG/ACT inhaler; Inhale 2 puffs into the lungs every 4 (four) hours as needed for wheezing or shortness of breath (cough, shortness of breath or wheezing.).  Dispense: 1 Inhaler; Refill: 1  2. Essential hypertension - lisinopril-hydrochlorothiazide (ZESTORETIC) 20-12.5 MG tablet; Take 1 tablet by mouth daily.  Dispense: 90 tablet; Refill: 1 - Basic metabolic panel - encouraged her to have follow up every 6 months  3. Vitamin D deficiency - VITAMIN D 25 Hydroxy (Vit-D Deficiency, Fractures)   Olean Reeeborah Thersa Mohiuddin, FNP-BC  [facility]  03/26/2016 4:09 PM

## 2016-03-26 NOTE — Patient Instructions (Signed)
  Please follow up in 6 months for electrolyte check   IF you received an x-ray today, you will receive an invoice from [facility]. Please contact [facility] at [phone] with questions or concerns regarding your invoice.   IF you received labwork today, you will receive an invoice from [facility]. Please contact [facility] at [phone] with questions or concerns regarding your invoice.   Our billing staff will not be able to assist you with questions regarding bills from these companies.  You will be contacted with the lab results as soon as they are available. The fastest way to get your results is to activate your My Chart account. Instructions are located on the last page of this paperwork. If you have not heard from us regarding the results in 2 weeks, please contact this office.

## 2016-03-27 ENCOUNTER — Encounter: Payer: Self-pay | Admitting: Family Medicine

## 2016-03-27 LAB — VITAMIN D 25 HYDROXY (VIT D DEFICIENCY, FRACTURES): Vit D, 25-Hydroxy: 35 ng/mL (ref 30–100)

## 2016-12-28 ENCOUNTER — Ambulatory Visit (INDEPENDENT_AMBULATORY_CARE_PROVIDER_SITE_OTHER): Payer: BLUE CROSS/BLUE SHIELD | Admitting: Physician Assistant

## 2016-12-28 VITALS — BP 124/80 | HR 85 | Temp 98.3°F | Resp 16 | Ht 70.5 in | Wt 214.2 lb

## 2016-12-28 DIAGNOSIS — I10 Essential (primary) hypertension: Secondary | ICD-10-CM | POA: Diagnosis not present

## 2016-12-28 DIAGNOSIS — L309 Dermatitis, unspecified: Secondary | ICD-10-CM | POA: Diagnosis not present

## 2016-12-28 DIAGNOSIS — R062 Wheezing: Secondary | ICD-10-CM | POA: Diagnosis not present

## 2016-12-28 DIAGNOSIS — R011 Cardiac murmur, unspecified: Secondary | ICD-10-CM | POA: Diagnosis not present

## 2016-12-28 MED ORDER — ALBUTEROL SULFATE HFA 108 (90 BASE) MCG/ACT IN AERS: 2.0000 | INHALATION_SPRAY | RESPIRATORY_TRACT | 1 refills | Status: DC | PRN

## 2016-12-28 MED ORDER — LISINOPRIL-HYDROCHLOROTHIAZIDE 20-12.5 MG PO TABS: 1.0000 | ORAL_TABLET | Freq: Every day | ORAL | 1 refills | Status: DC

## 2016-12-28 MED ORDER — TRIAMCINOLONE ACETONIDE 0.1 % EX CREA: 1.0000 "application " | TOPICAL_CREAM | Freq: Two times a day (BID) | CUTANEOUS | 1 refills | Status: DC

## 2016-12-28 NOTE — Patient Instructions (Addendum)
I will contact you with your lab results as soon as they are available.   If you have not heard from me in 2 weeks, please contact me.  The fastest way to get your results is to register for My Chart (see the instructions on the last page of this printout).   IF you received an x-ray today, you will receive an invoice from [facility]. Please contact [facility] at [phone] with questions or concerns regarding your invoice.   IF you received labwork today, you will receive an invoice from [facility]. Please contact [facility] at [phone] with questions or concerns regarding your invoice.   Our billing staff will not be able to assist you with questions regarding bills from these companies.  You will be contacted with the lab results as soon as they are available. The fastest way to get your results is to activate your My Chart account. Instructions are located on the last page of this paperwork. If you have not heard from us regarding the results in 2 weeks, please contact this office.      

## 2016-12-28 NOTE — Progress Notes (Signed)
Jessica Moreno  MRN: [id number] DOB: December 12, 1975  PCP: No PCP Per Patient  Chief Complaint  Patient presents with  . Medication Refill    Albuterol and Lisinopril  . Rash    Back of neck, X 1 week    Subjective:  Pt presents to clinic for medication refill.  HTN - she checks her BP at home - when on medication 120/80 - she takes her medication daily - without problems Asthma - only uses albuterol when she is sick - an inhaler lasts her all year.  Also has a rash on her back for a week - it is getting larger - very itchy - no change in lotions, soaps or detergents.  She has used nothing on it.  No body at home or work with similar rash.  Review of Systems  Constitutional: Negative for chills and fever.  Respiratory: Negative for shortness of breath.   Cardiovascular: Negative for chest pain, palpitations and leg swelling.  Neurological: Negative for headaches.    Patient Active Problem List   Diagnosis Date Noted  . Heart murmur 12/28/2016  . Heart murmur, systolic 41/74/0814  . Obesity (BMI 30.0-34.9) 01/12/2015  . Family history of hypertension 01/12/2015  . Family history of cardiovascular disease 01/12/2015  . HTN (hypertension) 02/12/2014    No current outpatient prescriptions on file prior to visit.   No current facility-administered medications on file prior to visit.     Allergies  Allergen Reactions  . Penicillins     Pt patients past, family and social history were reviewed and updated.   Objective:  BP 124/80   Pulse 85   Temp 98.3 F (36.8 C) (Oral)   Resp 16   Ht 5' 10.5" (1.791 m)   Wt 214 lb 3.2 oz (97.2 kg)   LMP 12/12/2016 (Approximate)   SpO2 97%   BMI 30.30 kg/m   Physical Exam  Constitutional: She is oriented to person, place, and time and well-developed, well-nourished, and in no distress.  HENT:  Head: Normocephalic and atraumatic.  Right Ear: Hearing and external ear normal.  Left Ear: Hearing and external ear normal.    Eyes: Conjunctivae are normal.  Neck: Normal range of motion.  Cardiovascular: Normal rate and regular rhythm.   Murmur: 2/6 systolic murmur. Pulmonary/Chest: Effort normal and breath sounds normal. She has no wheezes.  Neurological: She is alert and oriented to person, place, and time. Gait normal.  Skin: Skin is warm and dry.  Dry skin on the back of her neck  Psychiatric: Mood, memory, affect and judgment normal.  Vitals reviewed.   Assessment and Plan :  Essential hypertension - Plan: CMP14+EGFR, lisinopril-hydrochlorothiazide (ZESTORETIC) 20-12.5 MG tablet - continue current medications  Heart murmur - she has been evaluated by cardiologist  Wheezing - Plan: albuterol (PROVENTIL HFA;VENTOLIN HFA) 108 (90 Base) MCG/ACT inhaler - pt will continue using as she is sick   Eczema, unspecified type - Plan: triamcinolone cream (KENALOG) 0.1 %  Heart murmur, systolic  Windell Hummingbird PA-C  [facility] [place] 12/30/2016 10:19 AM

## 2016-12-29 LAB — CMP14+EGFR
ALK PHOS: 66 IU/L (ref 39–117)
ALT: 27 IU/L (ref 0–32)
AST: 30 IU/L (ref 0–40)
Albumin/Globulin Ratio: 1.5 (ref 1.2–2.2)
Albumin: 4.1 g/dL (ref 3.5–5.5)
BUN/Creatinine Ratio: 20 (ref 9–23)
BUN: 18 mg/dL (ref 6–24)
CHLORIDE: 101 mmol/L (ref 96–106)
CO2: 27 mmol/L (ref 18–29)
Calcium: 9.2 mg/dL (ref 8.7–10.2)
Creatinine, Ser: 0.92 mg/dL (ref 0.57–1.00)
GFR calc Af Amer: 89 mL/min/{1.73_m2} (ref >59)
GFR calc non Af Amer: 77 mL/min/{1.73_m2} (ref >59)
GLUCOSE: 105 mg/dL — AB (ref 65–99)
Globulin, Total: 2.8 g/dL (ref 1.5–4.5)
Potassium: 3.9 mmol/L (ref 3.5–5.2)
Sodium: 141 mmol/L (ref 134–144)
TOTAL PROTEIN: 6.9 g/dL (ref 6.0–8.5)

## 2017-06-17 ENCOUNTER — Encounter: Payer: Self-pay | Admitting: Family Medicine

## 2017-06-17 ENCOUNTER — Ambulatory Visit (INDEPENDENT_AMBULATORY_CARE_PROVIDER_SITE_OTHER): Payer: BLUE CROSS/BLUE SHIELD | Admitting: Family Medicine

## 2017-06-17 VITALS — BP 136/89 | HR 84 | Temp 98.3°F | Resp 16 | Ht 70.5 in | Wt 222.4 lb

## 2017-06-17 DIAGNOSIS — J029 Acute pharyngitis, unspecified: Secondary | ICD-10-CM

## 2017-06-17 DIAGNOSIS — R591 Generalized enlarged lymph nodes: Secondary | ICD-10-CM

## 2017-06-17 LAB — POCT RAPID STREP A (OFFICE): Rapid Strep A Screen: NEGATIVE

## 2017-06-17 MED ORDER — AZITHROMYCIN 250 MG PO TABS: ORAL_TABLET | ORAL | 0 refills | Status: DC

## 2017-06-17 NOTE — Progress Notes (Signed)
Subjective:  By signing my name below, I, [name], attest that this documentation has been prepared under the direction and in the presence of Shade Flood, MD Electronically Signed: Charline Bills, ED Scribe 06/17/2017 at 10:25 AM.   Patient ID: Jessica Moreno, female    DOB: [birth year], 41 y.o.   MRN: [id number]  Chief Complaint  Patient presents with  . Sore Throat    x4-5 days and getting worse   HPI Jessica Moreno is a 41 y.o. female who presents to Primary Care at [facility] complaining of a gradually worsening sore throat x 5 days. Pt reports dry cough onset today which exacerbates sore throat, adenopathy, chills and night sweats last night. She has tried coricidin and Aleve. Denies congestion, rhinorrhea, measured fever. No known sick contacts. Allergy to penicillins.   Patient Active Problem List   Diagnosis Date Noted  . Heart murmur 12/28/2016  . Heart murmur, systolic 12/28/2016  . Obesity (BMI 30.0-34.9) 01/12/2015  . Family history of hypertension 01/12/2015  . Family history of cardiovascular disease 01/12/2015  . HTN (hypertension) 02/12/2014   Past Medical History:  Diagnosis Date  . Hypertension    Past Surgical History:  Procedure Laterality Date  . D & C     Allergies  Allergen Reactions  . Penicillins    Prior to Admission medications   Medication Sig Start Date End Date Taking? Authorizing Provider  albuterol (PROVENTIL HFA;VENTOLIN HFA) 108 (90 Base) MCG/ACT inhaler Inhale 2 puffs into the lungs every 4 (four) hours as needed for wheezing or shortness of breath (cough, shortness of breath or wheezing.). 12/28/16   Weber, Dema Severin, PA-C  lisinopril-hydrochlorothiazide (ZESTORETIC) 20-12.5 MG tablet Take 1 tablet by mouth daily. 12/28/16   Weber, Dema Severin, PA-C  triamcinolone cream (KENALOG) 0.1 % Apply 1 application topically 2 (two) times daily. 12/28/16   Valarie Cones, Dema Severin, PA-C   Social History   Social History  . Marital status: Single   Spouse name: N/A  . Number of children: N/A  . Years of education: N/A   Occupational History  . Not on file.   Social History Main Topics  . Smoking status: Never Smoker  . Smokeless tobacco: Never Used  . Alcohol use Yes  . Drug use: No  . Sexual activity: Not on file   Other Topics Concern  . Not on file   Social History Narrative  . No narrative on file   Review of Systems  Constitutional: Positive for chills and diaphoresis. Negative for fever.  HENT: Positive for sore throat. Negative for congestion and rhinorrhea.   Respiratory: Positive for cough.   Hematological: Positive for adenopathy.      Objective:   Physical Exam  Constitutional: She is oriented to person, place, and time. She appears well-developed and well-nourished. No distress.  HENT:  Head: Normocephalic and atraumatic.  Right Ear: Hearing, tympanic membrane, external ear and ear canal normal.  Left Ear: Hearing, tympanic membrane, external ear and ear canal normal.  Nose: Nose normal.  Mouth/Throat: Posterior oropharyngeal erythema (minimal) present. No oropharyngeal exudate.  Eyes: Pupils are equal, round, and reactive to light. Conjunctivae and EOM are normal.  Cardiovascular: Normal rate, regular rhythm, normal heart sounds and intact distal pulses.   No murmur heard. Pulmonary/Chest: Effort normal and breath sounds normal. No respiratory distress. She has no wheezes. She has no rhonchi.  Abdominal: Soft. There is no hepatosplenomegaly. There is no tenderness.  No apparent HSM.  Lymphadenopathy:    She  has cervical adenopathy.       Right cervical: Posterior cervical adenopathy present.  AC node on the L that is enlarged and tender  Neurological: She is alert and oriented to person, place, and time.  Skin: Skin is warm and dry. No rash noted.  Psychiatric: She has a normal mood and affect. Her behavior is normal.  Vitals reviewed.  Vitals:   06/17/17 1016  BP: 136/89  Pulse: 84  Resp: 16    Temp: 98.3 F (36.8 C)  TempSrc: Oral  SpO2: 98%  Weight: 222 lb 6.4 oz (100.9 kg)  Height: 5' 10.5" (1.791 m)      Assessment & Plan:    Jessica Moreno is a 41 y.o. female Sore throat - Plan: POCT rapid strep A, Culture, Group A Strep, azithromycin (ZITHROMAX) 250 MG tablet, CANCELED: POCT rapid strep A, CANCELED: Culture, Group A Strep  LAD (lymphadenopathy) - Plan: POCT rapid strep A, Culture, Group A Strep, azithromycin (ZITHROMAX) 250 MG tablet  Isolated sore throat with lymphadenopathy, worsening symptoms past 6 days. Minimal dry cough today, may be more irritation with throat. Possible fever last night. Possible false negative strep test above symptoms/exam.  -Start azithromycin, check throat culture, RTC precautions and symptomatic care discussed.  Meds ordered this encounter  Medications  . azithromycin (ZITHROMAX) 250 MG tablet    Sig: Take 2 pills by mouth on day 1, then 1 pill by mouth per day on days 2 through 5.    Dispense:  6 tablet    Refill:  0   Patient Instructions    I will check for strep throat, including throat culture, but with current symptoms and exam today, okay to start antibiotic. If any worsening of symptoms including fevers beyond the next 24-36 hrs., more difficulty swallowing, or other worsening symptoms, please return for recheck.   Sore Throat A sore throat is pain, burning, irritation, or scratchiness in the throat. When you have a sore throat, you may feel pain or tenderness in your throat when you swallow or talk. Many things can cause a sore throat, including:  An infection.  Seasonal allergies.  Dryness in the air.  Irritants, such as smoke or pollution.  Gastroesophageal reflux disease (GERD).  A tumor.  A sore throat is often the first sign of another sickness. It may happen with other symptoms, such as coughing, sneezing, fever, and swollen neck glands. Most sore throats go away without medical treatment. Follow these  instructions at home:  Take over-the-counter medicines only as told by your health care provider.  Drink enough fluids to keep your urine clear or pale yellow.  Rest as needed.  To help with pain, try: ? Sipping warm liquids, such as broth, herbal tea, or warm water. ? Eating or drinking cold or frozen liquids, such as frozen ice pops. ? Gargling with a salt-water mixture 3-4 times a day or as needed. To make a salt-water mixture, completely dissolve -1 tsp of salt in 1 cup of warm water. ? Sucking on hard candy or throat lozenges. ? Putting a cool-mist humidifier in your bedroom at night to moisten the air. ? Sitting in the bathroom with the door closed for 5-10 minutes while you run hot water in the shower.  Do not use any tobacco products, such as cigarettes, chewing tobacco, and e-cigarettes. If you need help quitting, ask your health care provider. Contact a health care provider if:  You have a fever for more than 2-3 days.  You have  symptoms that last (are persistent) for more than 2-3 days.  Your throat does not get better within 7 days.  You have a fever and your symptoms suddenly get worse. Get help right away if:  You have difficulty breathing.  You cannot swallow fluids, soft foods, or your saliva.  You have increased swelling in your throat or neck.  You have persistent nausea and vomiting. This information is not intended to replace advice given to you by your health care provider. Make sure you discuss any questions you have with your health care provider. Document Released: 11/07/2004 Document Revised: 05/26/2016 Document Reviewed: 07/21/2015 Elsevier Interactive Patient Education  2018 [employer]   IF you received an x-ray today, you will receive an invoice from [facility]. Please contact [facility] at [phone] with questions or concerns regarding your invoice.   IF you received labwork today, you will receive an invoice from  [facility]. Please contact [facility] at [phone] with questions or concerns regarding your invoice.   Our billing staff will not be able to assist you with questions regarding bills from these companies.  You will be contacted with the lab results as soon as they are available. The fastest way to get your results is to activate your My Chart account. Instructions are located on the last page of this paperwork. If you have not heard from us regarding the results in 2 weeks, please contact this office.       I personally performed the services described in this documentation, which was scribed in my presence. The recorded information has been reviewed and considered for accuracy and completeness, addended by me as needed, and agree with information above.  Signed,   Meredith StaggersJeffrey Jathniel Smeltzer, MD Primary Care at [facility].  06/17/17 10:40 AM

## 2017-06-17 NOTE — Patient Instructions (Addendum)
  I will check for strep throat, including throat culture, but with current symptoms and exam today, okay to start antibiotic. If any worsening of symptoms including fevers beyond the next 24-36 hrs., more difficulty swallowing, or other worsening symptoms, please return for recheck.   Sore Throat A sore throat is pain, burning, irritation, or scratchiness in the throat. When you have a sore throat, you may feel pain or tenderness in your throat when you swallow or talk. Many things can cause a sore throat, including:  An infection.  Seasonal allergies.  Dryness in the air.  Irritants, such as smoke or pollution.  Gastroesophageal reflux disease (GERD).  A tumor.  A sore throat is often the first sign of another sickness. It may happen with other symptoms, such as coughing, sneezing, fever, and swollen neck glands. Most sore throats go away without medical treatment. Follow these instructions at home:  Take over-the-counter medicines only as told by your health care provider.  Drink enough fluids to keep your urine clear or pale yellow.  Rest as needed.  To help with pain, try: ? Sipping warm liquids, such as broth, herbal tea, or warm water. ? Eating or drinking cold or frozen liquids, such as frozen ice pops. ? Gargling with a salt-water mixture 3-4 times a day or as needed. To make a salt-water mixture, completely dissolve -1 tsp of salt in 1 cup of warm water. ? Sucking on hard candy or throat lozenges. ? Putting a cool-mist humidifier in your bedroom at night to moisten the air. ? Sitting in the bathroom with the door closed for 5-10 minutes while you run hot water in the shower.  Do not use any tobacco products, such as cigarettes, chewing tobacco, and e-cigarettes. If you need help quitting, ask your health care provider. Contact a health care provider if:  You have a fever for more than 2-3 days.  You have symptoms that last (are persistent) for more than 2-3  days.  Your throat does not get better within 7 days.  You have a fever and your symptoms suddenly get worse. Get help right away if:  You have difficulty breathing.  You cannot swallow fluids, soft foods, or your saliva.  You have increased swelling in your throat or neck.  You have persistent nausea and vomiting. This information is not intended to replace advice given to you by your health care provider. Make sure you discuss any questions you have with your health care provider. Document Released: 11/07/2004 Document Revised: 05/26/2016 Document Reviewed: 07/21/2015 Elsevier Interactive Patient Education  2018 [employer]   IF you received an x-ray today, you will receive an invoice from [facility]. Please contact [facility] at [phone] with questions or concerns regarding your invoice.   IF you received labwork today, you will receive an invoice from [facility]. Please contact [facility] at [phone] with questions or concerns regarding your invoice.   Our billing staff will not be able to assist you with questions regarding bills from these companies.  You will be contacted with the lab results as soon as they are available. The fastest way to get your results is to activate your My Chart account. Instructions are located on the last page of this paperwork. If you have not heard from us regarding the results in 2 weeks, please contact this office.

## 2017-06-19 ENCOUNTER — Emergency Department (HOSPITAL_COMMUNITY)
Admission: EM | Admit: 2017-06-19 | Discharge: 2017-06-19 | Disposition: A | Discharge status: Home / Self Care | Payer: BLUE CROSS/BLUE SHIELD | Attending: Emergency Medicine | Admitting: Emergency Medicine

## 2017-06-19 ENCOUNTER — Emergency Department (HOSPITAL_COMMUNITY): Payer: BLUE CROSS/BLUE SHIELD

## 2017-06-19 ENCOUNTER — Encounter (HOSPITAL_COMMUNITY): Payer: Self-pay | Admitting: Oncology

## 2017-06-19 DIAGNOSIS — I10 Essential (primary) hypertension: Secondary | ICD-10-CM | POA: Diagnosis not present

## 2017-06-19 DIAGNOSIS — K529 Noninfective gastroenteritis and colitis, unspecified: Secondary | ICD-10-CM | POA: Diagnosis not present

## 2017-06-19 DIAGNOSIS — J45909 Unspecified asthma, uncomplicated: Secondary | ICD-10-CM | POA: Diagnosis not present

## 2017-06-19 DIAGNOSIS — R109 Unspecified abdominal pain: Secondary | ICD-10-CM | POA: Diagnosis present

## 2017-06-19 HISTORY — DX: Unspecified asthma, uncomplicated: J45.909

## 2017-06-19 LAB — COMPREHENSIVE METABOLIC PANEL
ALK PHOS: 55 U/L (ref 38–126)
ALT: 26 U/L (ref 14–54)
AST: 26 U/L (ref 15–41)
Albumin: 4 g/dL (ref 3.5–5.0)
Anion gap: 13 (ref 5–15)
BILIRUBIN TOTAL: 0.3 mg/dL (ref 0.3–1.2)
BUN: 17 mg/dL (ref 6–20)
CALCIUM: 9.6 mg/dL (ref 8.9–10.3)
CO2: 25 mmol/L (ref 22–32)
CREATININE: 1.05 mg/dL — AB (ref 0.44–1.00)
Chloride: 103 mmol/L (ref 101–111)
GFR calc non Af Amer: >60 mL/min (ref >60)
GLUCOSE: 109 mg/dL — AB (ref 65–99)
Potassium: 3.3 mmol/L — ABNORMAL LOW (ref 3.5–5.1)
SODIUM: 141 mmol/L (ref 135–145)
TOTAL PROTEIN: 7.8 g/dL (ref 6.5–8.1)

## 2017-06-19 LAB — CBC
HCT: 37.6 % (ref 36.0–46.0)
Hemoglobin: 13.2 g/dL (ref 12.0–15.0)
MCH: 30.1 pg (ref 26.0–34.0)
MCHC: 35.1 g/dL (ref 30.0–36.0)
MCV: 85.6 fL (ref 78.0–100.0)
PLATELETS: 277 10*3/uL (ref 150–400)
RBC: 4.39 MIL/uL (ref 3.87–5.11)
RDW: 13.5 % (ref 11.5–15.5)
WBC: 10.2 10*3/uL (ref 4.0–10.5)

## 2017-06-19 LAB — URINALYSIS, ROUTINE W REFLEX MICROSCOPIC
BILIRUBIN URINE: NEGATIVE
Glucose, UA: NEGATIVE mg/dL
HGB URINE DIPSTICK: NEGATIVE
Ketones, ur: 5 mg/dL — AB
Leukocytes, UA: NEGATIVE
Nitrite: NEGATIVE
Protein, ur: NEGATIVE mg/dL
Specific Gravity, Urine: 1.017 (ref 1.005–1.030)
pH: 8 (ref 5.0–8.0)

## 2017-06-19 LAB — I-STAT BETA HCG BLOOD, ED (MC, WL, AP ONLY)

## 2017-06-19 LAB — LIPASE, BLOOD: Lipase: 24 U/L (ref 11–51)

## 2017-06-19 MED ORDER — ONDANSETRON HCL 4 MG/2ML IJ SOLN
4.0000 mg | Freq: Once | INTRAMUSCULAR | Status: AC
Start: 2017-06-19 — End: 2017-06-19
  Administered 2017-06-19: 4 mg via INTRAVENOUS
  Filled 2017-06-19: qty 2

## 2017-06-19 MED ORDER — ACETAMINOPHEN ER 650 MG PO TBCR: 650.0000 mg | EXTENDED_RELEASE_TABLET | Freq: Three times a day (TID) | ORAL | 0 refills | Status: DC | PRN

## 2017-06-19 MED ORDER — IOPAMIDOL (ISOVUE-300) INJECTION 61%
100.0000 mL | Freq: Once | INTRAVENOUS | Status: AC | PRN
  Administered 2017-06-19: 100 mL via INTRAVENOUS

## 2017-06-19 MED ORDER — MORPHINE SULFATE (PF) 4 MG/ML IV SOLN
4.0000 mg | Freq: Once | INTRAVENOUS | Status: AC
  Administered 2017-06-19: 4 mg via INTRAVENOUS
  Filled 2017-06-19: qty 1

## 2017-06-19 MED ORDER — LOPERAMIDE HCL 2 MG PO CAPS: 2.0000 mg | ORAL_CAPSULE | ORAL | 0 refills | Status: DC | PRN

## 2017-06-19 MED ORDER — IOPAMIDOL (ISOVUE-300) INJECTION 61%
INTRAVENOUS | Status: DC
Start: 2017-06-19 — End: 2017-06-19
  Filled 2017-06-19: qty 100

## 2017-06-19 MED ORDER — ONDANSETRON 4 MG PO TBDP: 4.0000 mg | ORAL_TABLET | Freq: Three times a day (TID) | ORAL | 0 refills | Status: DC | PRN

## 2017-06-19 MED ORDER — ONDANSETRON 4 MG PO TBDP
4.0000 mg | ORAL_TABLET | Freq: Once | ORAL | Status: AC
  Administered 2017-06-19: 4 mg via ORAL
  Filled 2017-06-19: qty 1

## 2017-06-19 MED ORDER — IBUPROFEN 600 MG PO TABS: 600.0000 mg | ORAL_TABLET | Freq: Four times a day (QID) | ORAL | 0 refills | Status: DC | PRN

## 2017-06-19 NOTE — ED Triage Notes (Signed)
Pt bib [facility] from home d/t N/V/D and abdominal pain since 1800 last night.  Pt given 4 mg IV zofran in route.  Pt rates pain 8/10.

## 2017-06-19 NOTE — Discharge Instructions (Signed)
Please return to the ER if your symptoms worsen; you have increased pain, fevers, chills, inability to keep any medications down, confusion. Otherwise see the outpatient doctor as requested.  

## 2017-06-19 NOTE — ED Notes (Signed)
Patient transported to CT 

## 2017-06-19 NOTE — ED Provider Notes (Signed)
[facility]-EMERGENCY DEPT Provider Note   CSN: [id number] Arrival date & time: 06/19/17  0131     History   Chief Complaint Chief Complaint  Patient presents with  . Abdominal Pain    HPI Cortny Bambach is a 41 y.o. female.  HPI Pt comes in with cc of nausea, emesis and abd pain. She has hx of HTN. She reports that she started having cramping abd pain in the evening followed by nausea and emesis (5-10 times) and diarrhea (5-10 episodes). No bile or frank blood.  At night, patient's symptoms continued to get worse, so she decided to come to the Er. Patient has no pain with urination, blood in the urine, or frequent urination. Pt also denies any vaginal discharge or bleeding. Pt has no hx of abd surgeries or pelvic disorders.     Past Medical History:  Diagnosis Date  . Asthma   . Hypertension     Patient Active Problem List   Diagnosis Date Noted  . Heart murmur 12/28/2016  . Heart murmur, systolic 12/28/2016  . Obesity (BMI 30.0-34.9) 01/12/2015  . Family history of hypertension 01/12/2015  . Family history of cardiovascular disease 01/12/2015  . HTN (hypertension) 02/12/2014    Past Surgical History:  Procedure Laterality Date  . D & C      OB History    No data available       Home Medications    Prior to Admission medications   Medication Sig Start Date End Date Taking? Authorizing Provider  albuterol (PROVENTIL HFA;VENTOLIN HFA) 108 (90 Base) MCG/ACT inhaler Inhale 2 puffs into the lungs every 4 (four) hours as needed for wheezing or shortness of breath (cough, shortness of breath or wheezing.). 12/28/16  Yes Weber, Dema Severin, PA-C  azithromycin (ZITHROMAX) 250 MG tablet Take 2 pills by mouth on day 1, then 1 pill by mouth per day on days 2 through 5. 06/17/17  Yes Shade Flood, MD  lisinopril-hydrochlorothiazide (ZESTORETIC) 20-12.5 MG tablet Take 1 tablet by mouth daily. 12/28/16  Yes Weber, Dema Severin, PA-C  acetaminophen (TYLENOL 8 HOUR) 650 MG CR tablet Take  1 tablet (650 mg total) by mouth every 8 (eight) hours as needed for pain. 06/19/17   Derwood Kaplan, MD  ibuprofen (ADVIL,MOTRIN) 600 MG tablet Take 1 tablet (600 mg total) by mouth every 6 (six) hours as needed. 06/19/17   Derwood Kaplan, MD  loperamide (IMODIUM) 2 MG capsule Take 1 capsule (2 mg total) by mouth as needed for diarrhea or loose stools. 06/19/17   Derwood Kaplan, MD  ondansetron (ZOFRAN ODT) 4 MG disintegrating tablet Take 1 tablet (4 mg total) by mouth every 8 (eight) hours as needed for nausea or vomiting. 06/19/17   Derwood Kaplan, MD    Family History No family history on file.  Social History Social History  Substance Use Topics  . Smoking status: Never Smoker  . Smokeless tobacco: Never Used  . Alcohol use Yes     Allergies   Penicillins   Review of Systems Review of Systems  Constitutional: Positive for activity change.  Gastrointestinal: Positive for abdominal pain, diarrhea, nausea and vomiting.  All other systems reviewed and are negative.     Physical Exam Updated Vital Signs BP 135/90 (BP Location: Right Arm)   Pulse 83   Temp 98.4 F (36.9 C) (Oral)   Resp 20   Ht  (1.778 m)   Wt 100.7 kg (222 lb)   LMP 06/03/2017 (Approximate)   SpO2 99%  BMI 31.85 kg/m   Physical Exam  Constitutional: She is oriented to person, place, and time. She appears well-developed.  HENT:  Head: Normocephalic and atraumatic.  Eyes: EOM are normal.  Neck: Normal range of motion. Neck supple.  Cardiovascular: Normal rate.   Pulmonary/Chest: Effort normal.  Abdominal: Bowel sounds are normal. There is tenderness. There is guarding. There is no rebound.  RLQ tenderness  Neurological: She is alert and oriented to person, place, and time.  Skin: Skin is warm and dry.  Nursing note and vitals reviewed.    ED Treatments / Results  Labs (all labs ordered are listed, but only abnormal results are displayed) Labs Reviewed  COMPREHENSIVE METABOLIC PANEL -  Abnormal; Notable for the following:       Result Value   Potassium 3.3 (*)    Glucose, Bld 109 (*)    Creatinine, Ser 1.05 (*)    All other components within normal limits  URINALYSIS, ROUTINE W REFLEX MICROSCOPIC - Abnormal; Notable for the following:    APPearance HAZY (*)    Ketones, ur 5 (*)    All other components within normal limits  LIPASE, BLOOD  CBC  I-STAT BETA HCG BLOOD, ED ([facility], [facility], [facility] ONLY)    EKG  EKG Interpretation None       Radiology Ct Abdomen Pelvis W Contrast  Result Date: 06/19/2017 CLINICAL DATA:  Mid abdominal pain since yesterday. Nausea, vomiting, and diarrhea. Low grade fever. EXAM: CT ABDOMEN AND PELVIS WITH CONTRAST TECHNIQUE: Multidetector CT imaging of the abdomen and pelvis was performed using the standard protocol following bolus administration of intravenous contrast. CONTRAST:  100mL ISOVUE-300 IOPAMIDOL (ISOVUE-300) INJECTION 61% COMPARISON:  None. FINDINGS: Lower chest: Atelectasis in the lung bases. Hepatobiliary: No focal liver abnormality is seen. No gallstones, gallbladder wall thickening, or biliary dilatation. Pancreas: Unremarkable. No pancreatic ductal dilatation or surrounding inflammatory changes. Spleen: Normal in size without focal abnormality. Adrenals/Urinary Tract: Adrenal glands are unremarkable. Kidneys are normal, without renal calculi, focal lesion, or hydronephrosis. Bladder is unremarkable. Stomach/Bowel: Stomach, small bowel, and colon are not abnormally distended. Pelvic small bowel loops demonstrate diffuse wall thickening and edema. No pneumatosis. Changes may represent enteritis due to infectious or inflammatory causes. Pattern is less likely to represent ischemia but not entirely excluded. Moderate free fluid in the abdomen and pelvis and mesenteric fluid and edema is likely reactive. Appendix is normal. No colonic wall thickening appreciated. Vascular/Lymphatic: No significant vascular findings are present. No enlarged abdominal  or pelvic lymph nodes. Reproductive: There is a right ovarian cyst measuring 3.4 cm diameter with mild increased density suggesting hemorrhagic cyst. This is likely functional. Uterus and left ovary are not enlarged. Other: No free air in the abdomen. Abdominal wall musculature appears intact. Musculoskeletal: No acute or significant osseous findings. IMPRESSION: 1. Small bowel wall thickening and edema in the pelvis likely represent enteritis due to infectious or inflammatory cause. 2. Free fluid in the abdomen and pelvis and mesenteric fluid and edema likely reactive. No evidence of perforation. 3. Hemorrhagic right ovarian cysts is likely functional. No follow-up indicated. Electronically Signed   By: Burman NievesWilliam  Stevens M.D.   On: 06/19/2017 03:49    Procedures Procedures (including critical care time)  Medications Ordered in ED Medications  iopamidol (ISOVUE-300) 61 % injection (not administered)  morphine 4 MG/ML injection 4 mg (4 mg Intravenous Given 06/19/17 0328)  ondansetron (ZOFRAN) injection 4 mg (4 mg Intravenous Given 06/19/17 0323)  iopamidol (ISOVUE-300) 61 % injection 100 mL (100 mLs  Intravenous Contrast Given 06/19/17 0335)  ondansetron (ZOFRAN-ODT) disintegrating tablet 4 mg (4 mg Oral Given 06/19/17 1610)     Initial Impression / Assessment and Plan / ED Course  I have reviewed the triage vital signs and the nursing notes.  Pertinent labs & imaging results that were available during my care of the patient were reviewed by me and considered in my medical decision making (see chart for details).  Clinical Course as of Jun 19 636  Thu Jun 19, 2017  9604 Pt reassessed. Pt's VSS and WNL. Pt's cap refill < 3 seconds. Pt has been hydrated in the ER and now passed po challenge. We will discharge with antiemetic. Strict ER return precautions have been discussed and pt will return if he is unable to tolerate fluids and symptoms are getting worse.   [AN]    Clinical Course User Index [AN]  Rhunette Croft, Coraline Talwar, MD    Pt with abd pain, nausea, emesis.  Abd pain now R sided. Gastroenteritis vs. appendicitis based on exam. CT is neg. PO challenge passed.   Final Clinical Impressions(s) / ED Diagnoses   Final diagnoses:  Gastroenteritis    New Prescriptions Discharge Medication List as of 06/19/2017  6:04 AM    START taking these medications   Details  acetaminophen (TYLENOL 8 HOUR) 650 MG CR tablet Take 1 tablet (650 mg total) by mouth every 8 (eight) hours as needed for pain., Starting Thu 06/19/2017, Print    ibuprofen (ADVIL,MOTRIN) 600 MG tablet Take 1 tablet (600 mg total) by mouth every 6 (six) hours as needed., Starting Thu 06/19/2017, Print    loperamide (IMODIUM) 2 MG capsule Take 1 capsule (2 mg total) by mouth as needed for diarrhea or loose stools., Starting Thu 06/19/2017, Print    ondansetron (ZOFRAN ODT) 4 MG disintegrating tablet Take 1 tablet (4 mg total) by mouth every 8 (eight) hours as needed for nausea or vomiting., Starting Thu 06/19/2017, Print         Derwood Kaplan, MD 06/19/17 [phone]

## 2017-06-19 NOTE — ED Notes (Signed)
Patient given Sprite for fluid challenge as requested. Will continue to monitor.

## 2017-06-20 LAB — CULTURE, GROUP A STREP: Strep A Culture: POSITIVE — AB

## 2017-12-22 IMAGING — CT CT ABD-PELV W/ CM
2 of 5 series · 16 of 46 positions shown, 18 images · IV contrast (ISOVUE)
Comparison: None.

CLINICAL DATA: Mid abdominal pain since yesterday. Nausea,
vomiting, and diarrhea. Low grade fever.

EXAM:
CT ABDOMEN AND PELVIS WITH CONTRAST
TECHNIQUE: Multidetector CT imaging of the abdomen and pelvis was performed
using the standard protocol following bolus administration of
intravenous contrast.
CONTRAST:  100mL KGT1XP-GRR IOPAMIDOL (KGT1XP-GRR) INJECTION 61%

[Series 2: abd/pel with · axial · 0.74mm/px · z∈[-379,+6]mm · 13 of 89 slices shown, 15 images]
[im 6/89  soft-tissue]
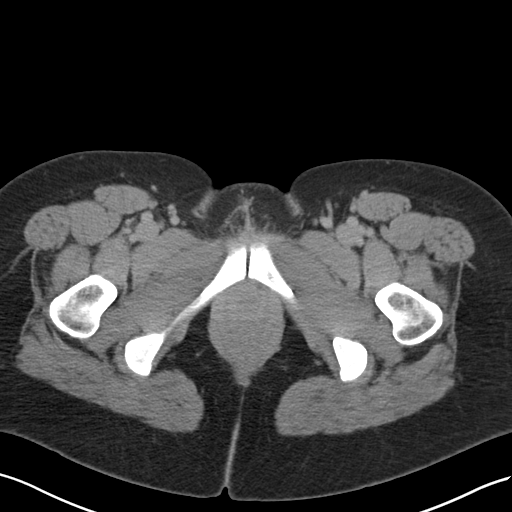
[im 6/89  bone]
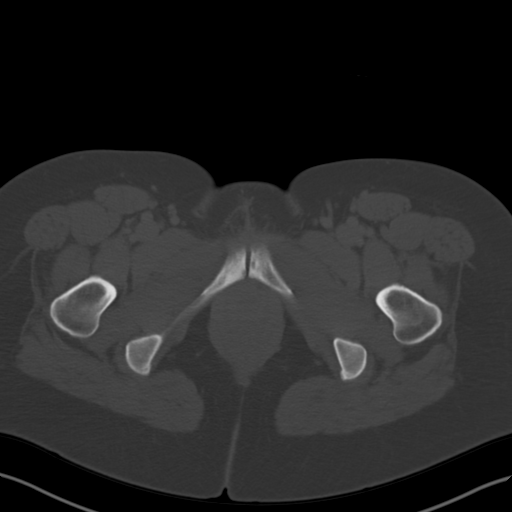
[im 12/89  soft-tissue]
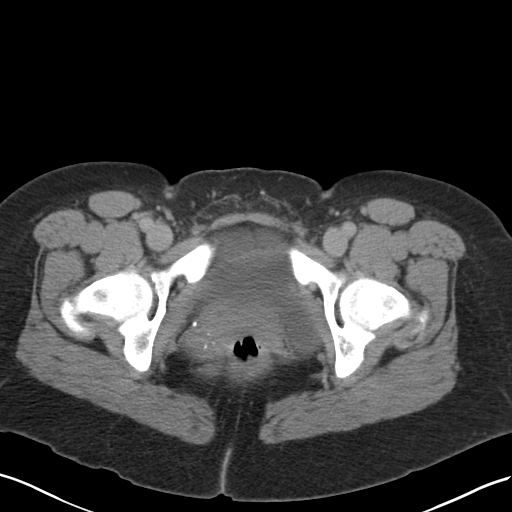
[im 17/89  soft-tissue]
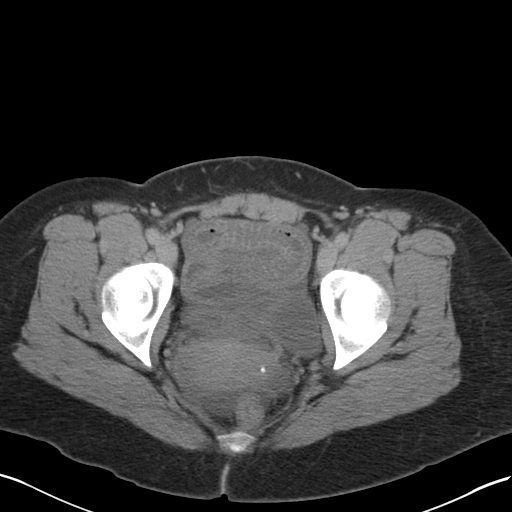
[im 28/89  soft-tissue]
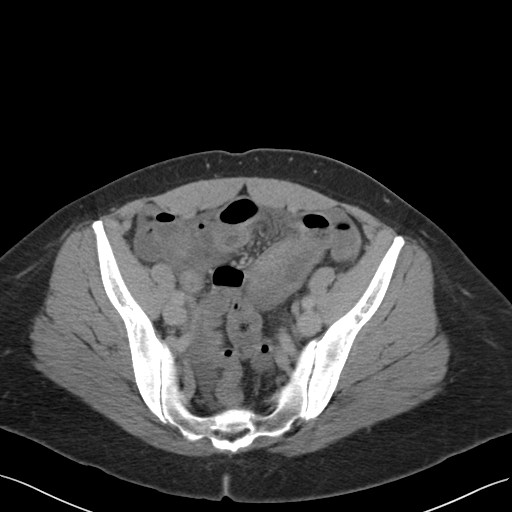
[im 34/89  soft-tissue]
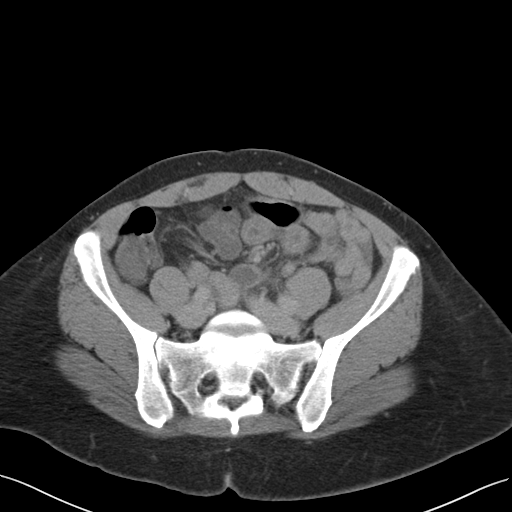
[im 39/89  soft-tissue]
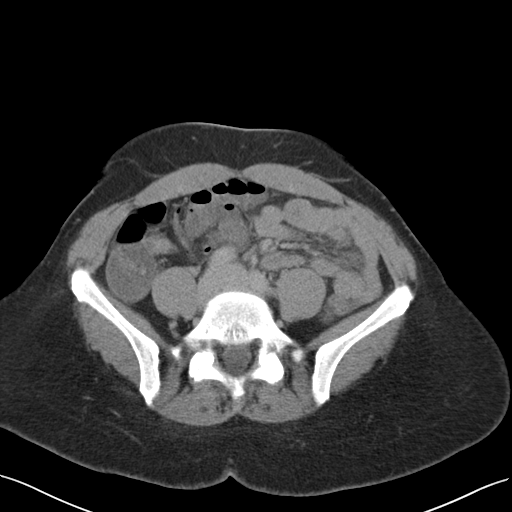
[im 45/89  soft-tissue]
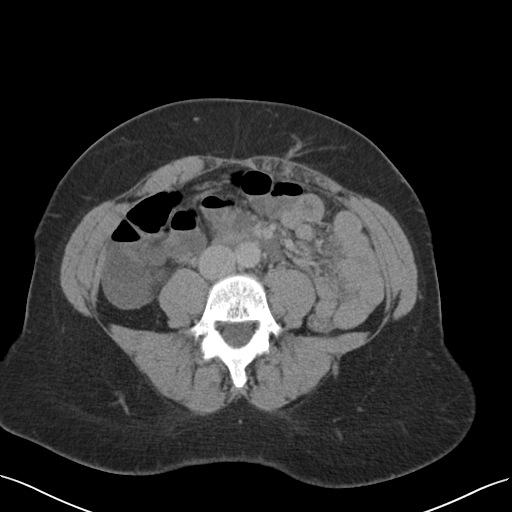
[im 50/89  soft-tissue]
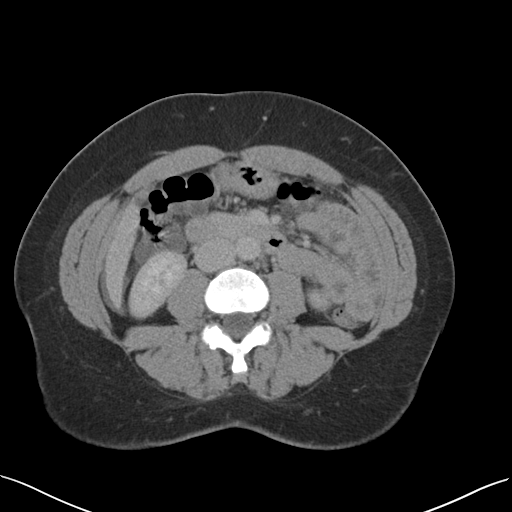
[im 56/89  soft-tissue]
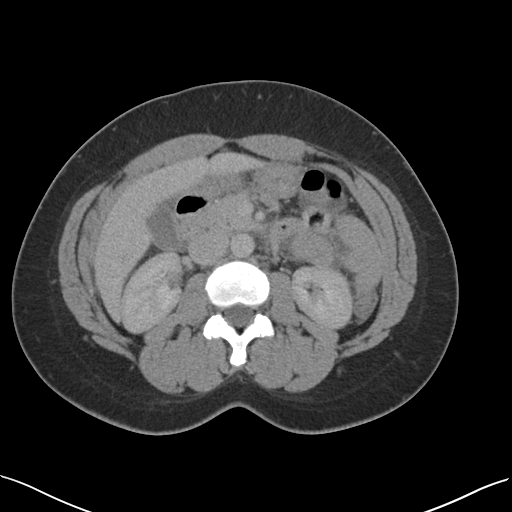
[im 56/89  bone]
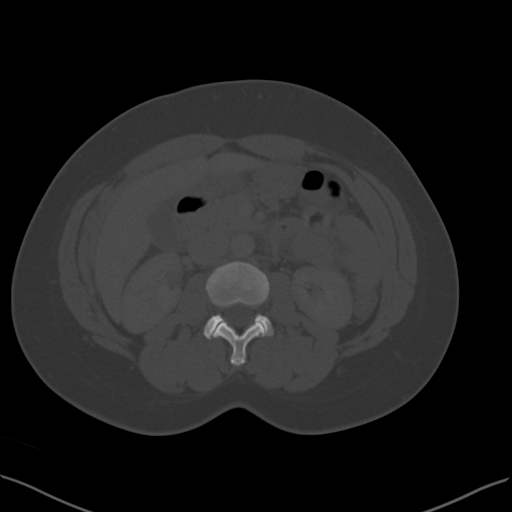
[im 61/89  soft-tissue]
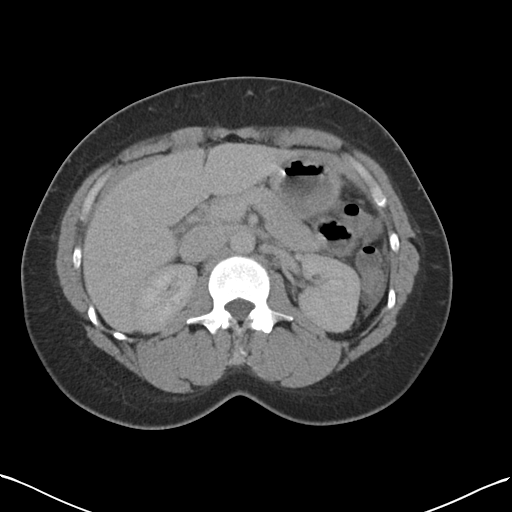
[im 72/89  soft-tissue]
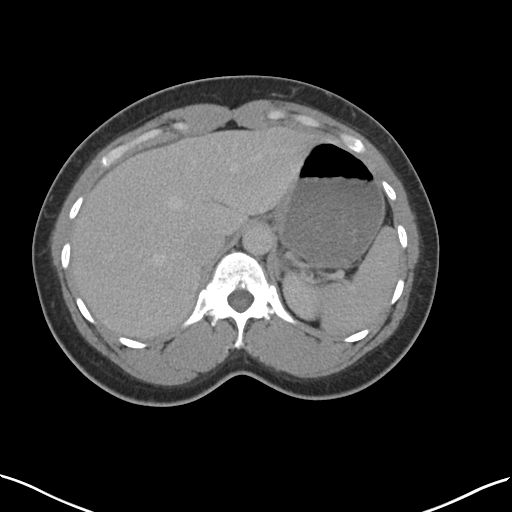
[im 78/89  soft-tissue]
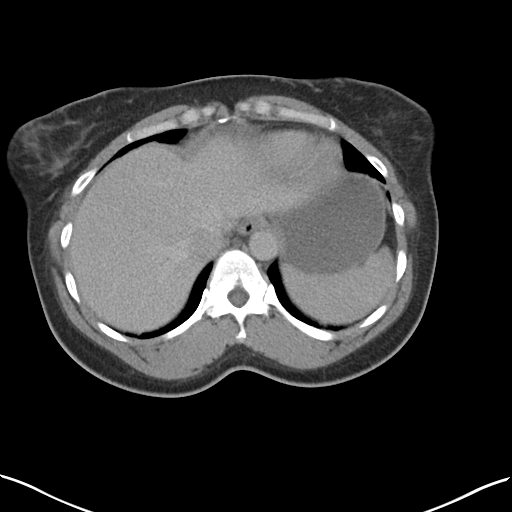
[im 83/89  soft-tissue]
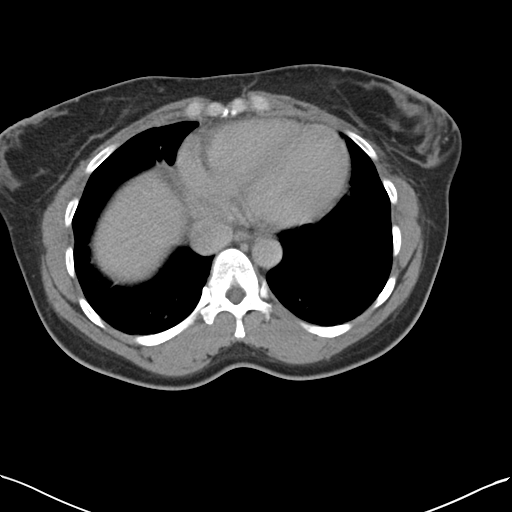

[Series 3: coronal a/|p · coronal · 0.87mm/px · 3 of 124 slices shown]
[im 42/124  soft-tissue]
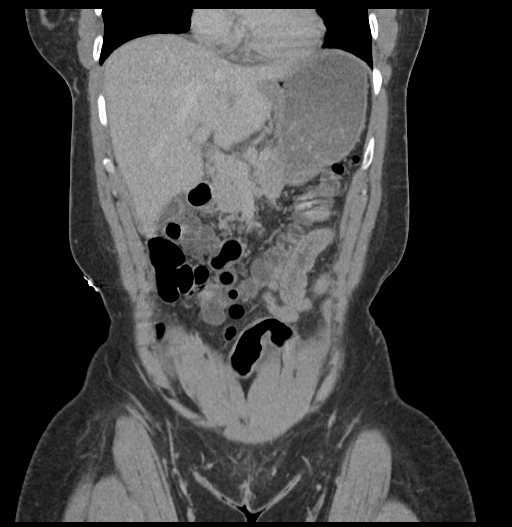
[im 55/124  soft-tissue]
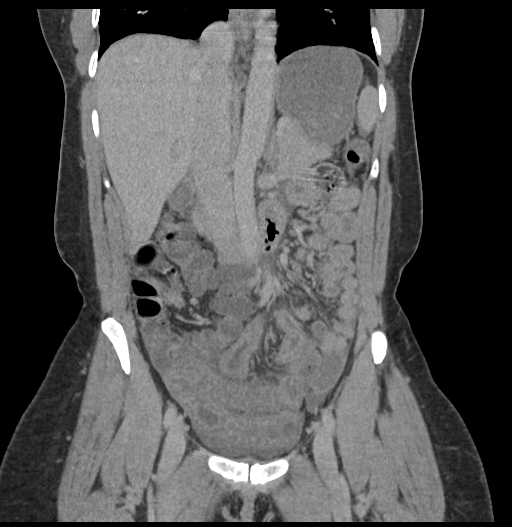
[im 69/124  soft-tissue]
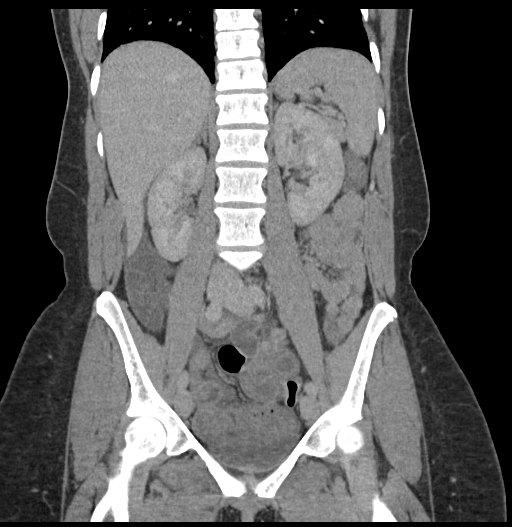

[16 of 46 positions shown; findings below may reference images not displayed]

FINDINGS: Lower chest: Atelectasis in the lung bases.

Hepatobiliary: No focal liver abnormality is seen. No gallstones,
gallbladder wall thickening, or biliary dilatation.

Pancreas: Unremarkable. No pancreatic ductal dilatation or
surrounding inflammatory changes.

Spleen: Normal in size without focal abnormality.

Adrenals/Urinary Tract: Adrenal glands are unremarkable. Kidneys are
normal, without renal calculi, focal lesion, or hydronephrosis.
Bladder is unremarkable.

Stomach/Bowel: Stomach, small bowel, and colon are not abnormally
distended. Pelvic small bowel loops demonstrate diffuse wall
thickening and edema. No pneumatosis. Changes may represent
enteritis due to infectious or inflammatory causes. Pattern is less
likely to represent ischemia but not entirely excluded. Moderate
free fluid in the abdomen and pelvis and mesenteric fluid and edema
is likely reactive. Appendix is normal. No colonic wall thickening
appreciated.

Vascular/Lymphatic: No significant vascular findings are present. No
enlarged abdominal or pelvic lymph nodes.

Reproductive: There is a right ovarian cyst measuring 3.4 cm
diameter with mild increased density suggesting hemorrhagic cyst.
This is likely functional. Uterus and left ovary are not enlarged.

Other: No free air in the abdomen. Abdominal wall musculature
appears intact.

Musculoskeletal: No acute or significant osseous findings.
IMPRESSION: 1. Small bowel wall thickening and edema in the pelvis likely
represent enteritis due to infectious or inflammatory cause.
2. Free fluid in the abdomen and pelvis and mesenteric fluid and
edema likely reactive. No evidence of perforation.
3. Hemorrhagic right ovarian cysts is likely functional. No
follow-up indicated.

## 2017-12-31 ENCOUNTER — Telehealth: Payer: Self-pay | Admitting: Family Medicine

## 2017-12-31 NOTE — Telephone Encounter (Signed)
Copied from CRM [id number]. Topic: Quick Communication - Rx Refill/Question >> Dec 31, 2017  3:32 PM Lelon FrohlichGolden, Weslee Fogg, [place] wrote: Medication:lisinopril 20-12.5 mg   Has the patient contacted their pharmacy?yes   (Agent: If no, request that the patient contact the pharmacy for the refill.)   Preferred Pharmacy (with phone number or street name): [facility] on [address]    Agent: Please be advised that RX refills may take up to 3 business days. We ask that you follow-up with your pharmacy.

## 2018-01-01 NOTE — Telephone Encounter (Signed)
Med refill

## 2018-01-06 ENCOUNTER — Other Ambulatory Visit: Payer: Self-pay

## 2018-01-06 ENCOUNTER — Ambulatory Visit (INDEPENDENT_AMBULATORY_CARE_PROVIDER_SITE_OTHER): Payer: BLUE CROSS/BLUE SHIELD | Admitting: Family Medicine

## 2018-01-06 ENCOUNTER — Encounter: Payer: Self-pay | Admitting: Family Medicine

## 2018-01-06 VITALS — BP 152/110 | HR 80 | Temp 99.0°F | Resp 18 | Ht 69.76 in | Wt 232.2 lb

## 2018-01-06 DIAGNOSIS — I10 Essential (primary) hypertension: Secondary | ICD-10-CM

## 2018-01-06 DIAGNOSIS — R062 Wheezing: Secondary | ICD-10-CM

## 2018-01-06 DIAGNOSIS — J029 Acute pharyngitis, unspecified: Secondary | ICD-10-CM | POA: Diagnosis not present

## 2018-01-06 DIAGNOSIS — J069 Acute upper respiratory infection, unspecified: Secondary | ICD-10-CM

## 2018-01-06 DIAGNOSIS — R739 Hyperglycemia, unspecified: Secondary | ICD-10-CM

## 2018-01-06 MED ORDER — LISINOPRIL-HYDROCHLOROTHIAZIDE 20-12.5 MG PO TABS: 1.0000 | ORAL_TABLET | Freq: Every day | ORAL | 1 refills | Status: DC

## 2018-01-06 MED ORDER — ALBUTEROL SULFATE HFA 108 (90 BASE) MCG/ACT IN AERS: 2.0000 | INHALATION_SPRAY | RESPIRATORY_TRACT | 1 refills | Status: DC | PRN

## 2018-01-06 NOTE — Patient Instructions (Addendum)
Restart blood pressure medicine same dose for now, but recheck in next 1 week. If any new weakness, slurred speech, or any worsening of your headache - call 911 or proceed to the emergency room.  Follow up next week or two and can check fasting lab work at that time.   Albuterol if needed for wheezing, but if you require that medicine more than twice per day return to discuss other treatment options. Saline nasal spray atleast 4 times per day if needed for congestion, over the counter mucinex or mucinex DM for cough, cepacol cough drops if needed for sore throat, tylenol if needed.  drink plenty of fluids.  Return to clinic if worsening.   Upper Respiratory Infection, Adult Most upper respiratory infections (URIs) are caused by a virus. A URI affects the nose, throat, and upper air passages. The most common type of URI is often called "the common cold." Follow these instructions at home:  Take medicines only as told by your doctor.  Gargle warm saltwater or take cough drops to comfort your throat as told by your doctor.  Use a warm mist humidifier or inhale steam from a shower to increase air moisture. This may make it easier to breathe.  Drink enough fluid to keep your pee (urine) clear or pale yellow.  Eat soups and other clear broths.  Have a healthy diet.  Rest as needed.  Go back to work when your fever is gone or your doctor says it is okay. ? You may need to stay home longer to avoid giving your URI to others. ? You can also wear a face mask and wash your hands often to prevent spread of the virus.  Use your inhaler more if you have asthma.  Do not use any tobacco products, including cigarettes, chewing tobacco, or electronic cigarettes. If you need help quitting, ask your doctor. Contact a doctor if:  You are getting worse, not better.  Your symptoms are not helped by medicine.  You have chills.  You are getting more short of breath.  You have brown or red  mucus.  You have yellow or brown discharge from your nose.  You have pain in your face, especially when you bend forward.  You have a fever.  You have puffy (swollen) neck glands.  You have pain while swallowing.  You have white areas in the back of your throat. Get help right away if:  You have very bad or constant: ? Headache. ? Ear pain. ? Pain in your forehead, behind your eyes, and over your cheekbones (sinus pain). ? Chest pain.  You have long-lasting (chronic) lung disease and any of the following: ? Wheezing. ? Long-lasting cough. ? Coughing up blood. ? A change in your usual mucus.  You have a stiff neck.  You have changes in your: ? Vision. ? Hearing. ? Thinking. ? Mood. This information is not intended to replace advice given to you by your health care provider. Make sure you discuss any questions you have with your health care provider. Document Released: 03/18/2008 Document Revised: 06/02/2016 Document Reviewed: 01/05/2014 Elsevier Interactive Patient Education  2018 [employer]   IF you received an x-ray today, you will receive an invoice from [facility]. Please contact [facility] at [phone] with questions or concerns regarding your invoice.   IF you received labwork today, you will receive an invoice from [facility]. Please contact [facility] at [phone] with questions or concerns regarding your invoice.   Our billing staff will  not be able to assist you with questions regarding bills from these companies.  You will be contacted with the lab results as soon as they are available. The fastest way to get your results is to activate your My Chart account. Instructions are located on the last page of this paperwork. If you have not heard from [place] regarding the results in 2 weeks, please contact this office.

## 2018-01-06 NOTE — Progress Notes (Addendum)
Subjective:  By signing my name below, I, Jessica Moreno, attest that this documentation has been prepared under the direction and in the presence of Jessica FloodJeffrey R Taggert Bozzi, MD Electronically Signed: Charline BillsEssence Moreno, ED Scribe 01/06/2018 at 5:12 PM.   Patient ID: Jessica Moreno, female    DOB: 01/20/1976, 42 y.o.   MRN: [id number]  Chief Complaint  Patient presents with  . Hypertension    f/u  . Medication Refill    Abuterol Sulfate, Linsinopril-hydrochlorothazide  . Oral Swelling    X 1 day- pt states a knot on her throat   HPI Jessica FillerDominique Moreno is a 42 y.o. female who [facility].  HTN Lisinopril-HCTZ 20-12.5 qd. Pt ran out of BP medication 1 wk ago and has noticed some mild dizziness and HA. She has noticed leg swelling and tingling in her feet in the past when she ran out of her BP meds but none at this time. States BP averages 127/86 while on medication. Denies lightheadedness, cp, sob, weakness in extremities, slurred speech. Lab Results  Component Value Date   CREATININE 1.05 (H) 06/19/2017   Hypokalemia Last checked in hospital at the time of a gastroenteritis. Potassium was 3.3.  Lipid Screening Lab Results  Component Value Date   CHOL 120 02/03/2014   HDL 73 02/03/2014   LDLCALC 37 02/03/2014   TRIG 50 02/03/2014   CHOLHDL 1.6 02/03/2014  Pt is not fasting at this visit; last ate around noon today.  Obesity Wt Readings from Last 3 Encounters:  01/06/18 232 lb 3.2 oz (105.3 kg)  06/19/17 222 lb (100.7 kg)  06/17/17 222 lb 6.4 oz (100.9 kg)  Body mass index is 33.54 kg/m. Glucose has been elevated 6 months ago and 1 yr ago. No recent A1C.  Wheezing Requests albuterol. Last discussed a few yrs ago. Pt is using inhaler when the weather changes, while working out and when she is ill with URI-symptoms. Reports she is currently using albuterol once-twice/day during flare-up.  Pt reports cough onset 2 days ago. She reports  nasal congestion, night sweats, sore throat onset and globus sensation onset yesterday. She has tried Mucinex and cough drops without significant relief. Denies fever.  Patient Active Problem List   Diagnosis Date Noted  . Heart murmur 12/28/2016  . Heart murmur, systolic 12/28/2016  . Obesity (BMI 30.0-34.9) 01/12/2015  . Family history of hypertension 01/12/2015  . Family history of cardiovascular disease 01/12/2015  . HTN (hypertension) 02/12/2014   Past Medical History:  Diagnosis Date  . Asthma   . Hypertension    Past Surgical History:  Procedure Laterality Date  . D & C     Allergies  Allergen Reactions  . Penicillins Other (See Comments)    Lesions Has patient had a PCN reaction causing immediate rash, facial/tongue/throat swelling, SOB or lightheadedness with hypotension: No Has patient had a PCN reaction causing severe rash involving mucus membranes or skin necrosis: No Has patient had a PCN reaction that required hospitalization: No Has patient had a PCN reaction occurring within the last 10 years: No If all of the above answers are "NO", then may proceed with Cephalosporin use.    Prior to Admission medications   Medication Sig Start Date End Date Taking? Authorizing Provider  acetaminophen (TYLENOL 8 HOUR) 650 MG CR tablet Take 1 tablet (650 mg total) by mouth every 8 (eight) hours as needed for pain. 06/19/17   Derwood KaplanNanavati, Ankit, MD  albuterol (PROVENTIL HFA;VENTOLIN HFA)  108 (90 Base) MCG/ACT inhaler Inhale 2 puffs into the lungs every 4 (four) hours as needed for wheezing or shortness of breath (cough, shortness of breath or wheezing.). 12/28/16   Valarie Cones, Dema Severin, PA-C  azithromycin (ZITHROMAX) 250 MG tablet Take 2 pills by mouth on day 1, then 1 pill by mouth per day on days 2 through 5. 06/17/17   Jessica Flood, MD  ibuprofen (ADVIL,MOTRIN) 600 MG tablet Take 1 tablet (600 mg total) by mouth every 6 (six) hours as needed. 06/19/17   Derwood Kaplan, MD    lisinopril-hydrochlorothiazide (ZESTORETIC) 20-12.5 MG tablet Take 1 tablet by mouth daily. 12/28/16   Valarie Cones, Dema Severin, PA-C  loperamide (IMODIUM) 2 MG capsule Take 1 capsule (2 mg total) by mouth as needed for diarrhea or loose stools. 06/19/17   Derwood Kaplan, MD  ondansetron (ZOFRAN ODT) 4 MG disintegrating tablet Take 1 tablet (4 mg total) by mouth every 8 (eight) hours as needed for nausea or vomiting. 06/19/17   Derwood Kaplan, MD   Social History   Socioeconomic History  . Marital status: Single    Spouse name: Not on file  . Number of children: 1  . Years of education: Not on file  . Highest education level: Not on file  Occupational History  . Not on file  Social Needs  . Financial resource strain: Not on file  . Food insecurity:    Worry: Not on file    Inability: Not on file  . Transportation needs:    Medical: Not on file    Non-medical: Not on file  Tobacco Use  . Smoking status: Never Smoker  . Smokeless tobacco: Never Used  Substance and Sexual Activity  . Alcohol use: Yes  . Drug use: No  . Sexual activity: Yes    Birth control/protection: Condom  Lifestyle  . Physical activity:    Days per week: Not on file    Minutes per session: Not on file  . Stress: Not on file  Relationships  . Social connections:    Talks on phone: Not on file    Gets together: Not on file    Attends religious service: Not on file    Active member of club or organization: Not on file    Attends meetings of clubs or organizations: Not on file    Relationship status: Not on file  . Intimate partner violence:    Fear of current or ex partner: Not on file    Emotionally abused: Not on file    Physically abused: Not on file    Forced sexual activity: Not on file  Other Topics Concern  . Not on file  Social History Narrative  . Not on file   Review of Systems  Constitutional: Positive for diaphoresis. Negative for fever.  HENT: Positive for congestion and sore throat.    Respiratory: Positive for cough and wheezing. Negative for shortness of breath.   Cardiovascular: Negative for chest pain.  Neurological: Positive for dizziness (mild) and headaches. Negative for speech difficulty, weakness and light-headedness.      Objective:   Physical Exam  Constitutional: She is oriented to person, place, and time. She appears well-developed and well-nourished. No distress.  HENT:  Head: Normocephalic and atraumatic.  Right Ear: Hearing, tympanic membrane, external ear and ear canal normal.  Left Ear: Hearing, tympanic membrane, external ear and ear canal normal.  Nose: Mucosal edema (minimal; bilaterally) present.  Mouth/Throat: Oropharynx is clear and moist and mucous membranes  are normal. No oropharyngeal exudate.  Eyes: Pupils are equal, round, and reactive to light. Conjunctivae and EOM are normal. Right eye exhibits no nystagmus. Left eye exhibits no nystagmus.  Neck:  Possible tender lower anterior node. Remainder of neck lymph nodes appear to be normal.  Cardiovascular: Normal rate, regular rhythm, normal heart sounds and intact distal pulses.  No murmur heard. Pulmonary/Chest: Effort normal and breath sounds normal. No respiratory distress. She has no wheezes. She has no rhonchi.  Neurological: She is alert and oriented to person, place, and time. She displays a negative Romberg sign.  No pronator drift. Nonfocal neuro exam.  Skin: Skin is warm and dry. No rash noted.  Psychiatric: She has a normal mood and affect. Her behavior is normal.  Vitals reviewed.  Vitals:   01/06/18 1659 01/06/18 1746  BP: (!) 142/100 (!) 152/110  Pulse: 80   Resp: 18   Temp: 99 F (37.2 C)   TempSrc: Oral   SpO2: 97%   Weight: 232 lb 3.2 oz (105.3 kg)   Height: 5' 9.76" (1.772 m)       Assessment & Plan:    Agness Sibrian is a 41 y.o. female Essential hypertension - Plan: Basic metabolic panel, lisinopril-hydrochlorothiazide (ZESTORETIC) 20-12.5 MG tablet  -  restart prior med regimen with close follow up in next week. ER precautions if any worsening of symptoms or not improving with restart of meds. Non focal neuro exam.   - plan for fasting labs next visit.  . Acute upper respiratory infection Sore throat, Wheezing  - Plan: albuterol (PROVENTIL HFA;VENTOLIN HFA) 108 (90 Base) MCG/ACT inhaler  -suspected viral illness with secondary wheeze. Symptomatic care discussed, including albuterol if needed. rtc precautions if worsening (or persistent frequent use of albuterol).   Hyperglycemia - Plan: Hemoglobin A1c   -prior elevated glucose - repeat labs and screen with A1C.    Meds ordered this encounter  Medications  . albuterol (PROVENTIL HFA;VENTOLIN HFA) 108 (90 Base) MCG/ACT inhaler    Sig: Inhale 2 puffs into the lungs every 4 (four) hours as needed for wheezing or shortness of breath (cough, shortness of breath or wheezing.).    Dispense:  1 Inhaler    Refill:  1  . lisinopril-hydrochlorothiazide (ZESTORETIC) 20-12.5 MG tablet    Sig: Take 1 tablet by mouth daily.    Dispense:  90 tablet    Refill:  1   Patient Instructions    Restart blood pressure medicine same dose for now, but recheck in next 1 week. If any new weakness, slurred speech, or any worsening of your headache - call 911 or proceed to the emergency room.  Follow up next week or two and can check fasting lab work at that time.   Albuterol if needed for wheezing, but if you require that medicine more than twice per day return to discuss other treatment options. Saline nasal spray atleast 4 times per day if needed for congestion, over the counter mucinex or mucinex DM for cough, cepacol cough drops if needed for sore throat, tylenol if needed.  drink plenty of fluids.  Return to clinic if worsening.   Upper Respiratory Infection, Adult Most upper respiratory infections (URIs) are caused by a virus. A URI affects the nose, throat, and upper air passages. The most common type of  URI is often called "the common cold." Follow these instructions at home:  Take medicines only as told by your doctor.  Gargle warm saltwater or take cough drops to comfort  your throat as told by your doctor.  Use a warm mist humidifier or inhale steam from a shower to increase air moisture. This may make it easier to breathe.  Drink enough fluid to keep your pee (urine) clear or pale yellow.  Eat soups and other clear broths.  Have a healthy diet.  Rest as needed.  Go back to work when your fever is gone or your doctor says it is okay. ? You may need to stay home longer to avoid giving your URI to others. ? You can also wear a face mask and wash your hands often to prevent spread of the virus.  Use your inhaler more if you have asthma.  Do not use any tobacco products, including cigarettes, chewing tobacco, or electronic cigarettes. If you need help quitting, ask your doctor. Contact a doctor if:  You are getting worse, not better.  Your symptoms are not helped by medicine.  You have chills.  You are getting more short of breath.  You have brown or red mucus.  You have yellow or brown discharge from your nose.  You have pain in your face, especially when you bend forward.  You have a fever.  You have puffy (swollen) neck glands.  You have pain while swallowing.  You have white areas in the back of your throat. Get help right away if:  You have very bad or constant: ? Headache. ? Ear pain. ? Pain in your forehead, behind your eyes, and over your cheekbones (sinus pain). ? Chest pain.  You have long-lasting (chronic) lung disease and any of the following: ? Wheezing. ? Long-lasting cough. ? Coughing up blood. ? A change in your usual mucus.  You have a stiff neck.  You have changes in your: ? Vision. ? Hearing. ? Thinking. ? Mood. This information is not intended to replace advice given to you by your health care provider. Make sure you discuss any  questions you have with your health care provider. Document Released: 03/18/2008 Document Revised: 06/02/2016 Document Reviewed: 01/05/2014 Elsevier Interactive Patient Education  2018 [employer].   IF you received an x-ray today, you will receive an invoice from [facility]. Please contact [facility] at [phone] with questions or concerns regarding your invoice.   IF you received labwork today, you will receive an invoice from [facility]. Please contact [facility] at [phone] with questions or concerns regarding your invoice.   Our billing staff will not be able to assist you with questions regarding bills from these companies.  You will be contacted with the lab results as soon as they are available. The fastest way to get your results is to activate your My Chart account. Instructions are located on the last page of this paperwork. If you have not heard from [place] regarding the results in 2 weeks, please contact this office.      I personally performed the services described in this documentation, which was scribed in my presence. The recorded information has been reviewed and considered for accuracy and completeness, addended by me as needed, and agree with information above.  Signed,   Meredith Staggers, MD Primary Care at [facility].  01/10/18 12:21 AM

## 2018-01-07 LAB — BASIC METABOLIC PANEL
BUN / CREAT RATIO: 18 (ref 9–23)
BUN: 19 mg/dL (ref 6–24)
CHLORIDE: 99 mmol/L (ref 96–106)
CO2: 24 mmol/L (ref 20–29)
Calcium: 9.3 mg/dL (ref 8.7–10.2)
Creatinine, Ser: 1.05 mg/dL — ABNORMAL HIGH (ref 0.57–1.00)
GFR calc non Af Amer: 65 mL/min/{1.73_m2} (ref >59)
GFR, EST AFRICAN AMERICAN: 75 mL/min/{1.73_m2} (ref >59)
Glucose: 85 mg/dL (ref 65–99)
POTASSIUM: 3.9 mmol/L (ref 3.5–5.2)
SODIUM: 138 mmol/L (ref 134–144)

## 2018-01-07 LAB — HEMOGLOBIN A1C
Est. average glucose Bld gHb Est-mCnc: 105 mg/dL
Hgb A1c MFr Bld: 5.3 % (ref 4.8–5.6)

## 2018-01-19 ENCOUNTER — Other Ambulatory Visit: Payer: Self-pay

## 2018-01-19 ENCOUNTER — Ambulatory Visit (INDEPENDENT_AMBULATORY_CARE_PROVIDER_SITE_OTHER): Payer: BLUE CROSS/BLUE SHIELD | Admitting: Family Medicine

## 2018-01-19 ENCOUNTER — Encounter: Payer: Self-pay | Admitting: Family Medicine

## 2018-01-19 ENCOUNTER — Encounter: Payer: Self-pay | Admitting: *Deleted

## 2018-01-19 VITALS — BP 128/85 | HR 74 | Temp 98.1°F | Ht 71.0 in | Wt 229.6 lb

## 2018-01-19 DIAGNOSIS — Z130 Encounter for screening for diseases of the blood and blood-forming organs and certain disorders involving the immune mechanism: Secondary | ICD-10-CM | POA: Diagnosis not present

## 2018-01-19 DIAGNOSIS — Z13220 Encounter for screening for lipoid disorders: Secondary | ICD-10-CM | POA: Diagnosis not present

## 2018-01-19 DIAGNOSIS — R42 Dizziness and giddiness: Secondary | ICD-10-CM | POA: Diagnosis not present

## 2018-01-19 DIAGNOSIS — R7989 Other specified abnormal findings of blood chemistry: Secondary | ICD-10-CM | POA: Diagnosis not present

## 2018-01-19 DIAGNOSIS — I10 Essential (primary) hypertension: Secondary | ICD-10-CM | POA: Diagnosis not present

## 2018-01-19 MED ORDER — LISINOPRIL-HYDROCHLOROTHIAZIDE 10-12.5 MG PO TABS: 1.0000 | ORAL_TABLET | Freq: Every day | ORAL | 1 refills | Status: DC

## 2018-01-19 NOTE — Progress Notes (Signed)
Subjective:  By signing my name below, I, [name], attest that this documentation has been prepared under the direction and in the presence of Shade Flood, MD Electronically Signed: Charline Bills, ED Scribe 01/19/2018 at 9:36 AM.   Patient ID: Jessica Moreno, female    DOB: Dec 18, 1975, 42 y.o.   MRN: [id number]  Chief Complaint  Patient presents with  . Hypertension    BP Med side effect=dizzy (comes in waves)   HPI Jessica Moreno is a 42 y.o. female who presents to Primary Care at [facility] for f/u. Last seen 3/26 with multiple concerns including HTN. She had been out of meds 1 wk prior, had mild dizziness and HA. Restarted Lisinopril-HCTZ at prior dose. She had a nonfocal neuro exam. Did have borderline elevated creatinine at 1.05, stable from previous. Normal A1C. Other electrolytes normal on BMP. BP Readings from Last 3 Encounters:  01/19/18 128/85  01/06/18 (!) 152/110  06/19/17 135/90   Pt states that she is still having waves of dizziness that lasts for a few mins and occurs several times/day. She does not check her BP while she experiences dizziness. Pt reports that she works out in the gym every morning, takes her BP medication after exercising and notices dizziness after she has arrived at working and is sitting at her desk working. She has noticed symptoms occur more frequently if she does not drink enough water so she drinks at least 100 oz of water/day and urinates 5-6 times/day. Denies nausea, syncope, dizziness while exercising or after exercising, feeling stress or anxious when symptoms occur. Father with a h/o heart disease (unsure of age), both grandfathers with h/o CHF (paternal grandfather in early 73s). Echo 01/2016: EF 65-70%. Mild tricuspid regurgitation, overall reassuring.  Pt's paternal grandmother passed this weekend.  Patient Active Problem List   Diagnosis Date Noted  . Heart murmur 12/28/2016  . Heart murmur, systolic 12/28/2016  . Obesity (BMI  30.0-34.9) 01/12/2015  . Family history of hypertension 01/12/2015  . Family history of cardiovascular disease 01/12/2015  . HTN (hypertension) 02/12/2014   Past Medical History:  Diagnosis Date  . Asthma   . Hypertension    Past Surgical History:  Procedure Laterality Date  . D & C     Allergies  Allergen Reactions  . Penicillins Other (See Comments)    Lesions Has patient had a PCN reaction causing immediate rash, facial/tongue/throat swelling, SOB or lightheadedness with hypotension: No Has patient had a PCN reaction causing severe rash involving mucus membranes or skin necrosis: No Has patient had a PCN reaction that required hospitalization: No Has patient had a PCN reaction occurring within the last 10 years: No If all of the above answers are "NO", then may proceed with Cephalosporin use.    Prior to Admission medications   Medication Sig Start Date End Date Taking? Authorizing Provider  albuterol (PROVENTIL HFA;VENTOLIN HFA) 108 (90 Base) MCG/ACT inhaler Inhale 2 puffs into the lungs every 4 (four) hours as needed for wheezing or shortness of breath (cough, shortness of breath or wheezing.). 01/06/18   Shade Flood, MD  ibuprofen (ADVIL,MOTRIN) 600 MG tablet Take 1 tablet (600 mg total) by mouth every 6 (six) hours as needed. 06/19/17   Derwood Kaplan, MD  lisinopril-hydrochlorothiazide (ZESTORETIC) 20-12.5 MG tablet Take 1 tablet by mouth daily. 01/06/18   Shade Flood, MD   Social History   Socioeconomic History  . Marital status: Single    Spouse name: Not on file  . Number  of children: 1  . Years of education: Not on file  . Highest education level: Not on file  Occupational History  . Not on file  Social Needs  . Financial resource strain: Not on file  . Food insecurity:    Worry: Not on file    Inability: Not on file  . Transportation needs:    Medical: Not on file    Non-medical: Not on file  Tobacco Use  . Smoking status: Never Smoker  .  Smokeless tobacco: Never Used  Substance and Sexual Activity  . Alcohol use: Yes  . Drug use: No  . Sexual activity: Yes    Birth control/protection: Condom  Lifestyle  . Physical activity:    Days per week: Not on file    Minutes per session: Not on file  . Stress: Not on file  Relationships  . Social connections:    Talks on phone: Not on file    Gets together: Not on file    Attends religious service: Not on file    Active member of club or organization: Not on file    Attends meetings of clubs or organizations: Not on file    Relationship status: Not on file  . Intimate partner violence:    Fear of current or ex partner: Not on file    Emotionally abused: Not on file    Physically abused: Not on file    Forced sexual activity: Not on file  Other Topics Concern  . Not on file  Social History Narrative  . Not on file   Review of Systems  Gastrointestinal: Negative for nausea.  Neurological: Positive for dizziness (intermittent). Negative for syncope.      Objective:   Physical Exam  Constitutional: She is oriented to person, place, and time. She appears well-developed and well-nourished.  HENT:  Head: Normocephalic and atraumatic.  Eyes: Pupils are equal, round, and reactive to light. Conjunctivae and EOM are normal. Right eye exhibits no nystagmus. Left eye exhibits no nystagmus.  Neck: Carotid bruit is not present.  Cardiovascular: Normal rate, regular rhythm and intact distal pulses.  Murmur heard.  Systolic (Grade 1-2/6) murmur is present. Pulmonary/Chest: Effort normal and breath sounds normal.  Abdominal: Soft. She exhibits no pulsatile midline mass. There is no tenderness.  Neurological: She is alert and oriented to person, place, and time. She displays a negative Romberg sign.  No pronator drift. Neg heel to toe. Nonfocal neuro test.  Skin: Skin is warm and dry.  Psychiatric: She has a normal mood and affect. Her behavior is normal.  Vitals  reviewed.  Vitals:   01/19/18 0929  BP: 128/85  Pulse: 74  Temp: 98.1 F (36.7 C)  TempSrc: Oral  SpO2: 98%  Weight: 229 lb 9.6 oz (104.1 kg)  Height: 5\' 11"  (1.803 m)    EKG reading done by Shade Flood, MD: sinus bradycardia, rate 55. PR 220. First degree AV block. No apparent changes from previous reading.    Assessment & Plan:  Jessica Moreno is a 42 y.o. female Essential hypertension - Plan: Comprehensive metabolic panel, lisinopril-hydrochlorothiazide (PRINZIDE,ZESTORETIC) 10-12.5 MG tablet Episode of dizziness - Plan: EKG 12-Lead, Orthostatic vital signs, CBC  -Appears overall controlled, but associated dizziness with taking medication.  Recommended monitoring home readings, will try lower dose of lisinopril HCTZ for right now, RTC precautions if symptoms persist as may need cardiology evaluation.  ER/RTC precautions if worsening  Screening for hyperlipidemia - Plan: Lipid panel  Elevated serum creatinine -  Plan: Comprehensive metabolic panel  -Borderline elevated but stable previously.  Restarted lisinopril HCTZ, recheck creatinine.  Screening, anemia, deficiency, iron - Plan: CBC   No orders of the defined types were placed in this encounter.  Patient Instructions    Make sure to drink plenty of water throughout the day.  Return the next day or 2 if possible for fasting blood work.  We can temporarily try a lower dose of the blood pressure combination pill, but monitor your blood pressure readings to make sure that it is not going up too high on that dose.   If dizziness spells continue in the next week or two, then I would like you to meet with cardiology.  Let me know and I will place that referral. Return to the clinic or go to the nearest emergency room if any of your symptoms worsen or new symptoms occur.  Dizziness Dizziness is a common problem. It is a feeling of unsteadiness or light-headedness. You may feel like you are about to faint. Dizziness can  lead to injury if you stumble or fall. Anyone can become dizzy, but dizziness is more common in older adults. This condition can be caused by a number of things, including medicines, dehydration, or illness. Follow these instructions at home: Eating and drinking  Drink enough fluid to keep your urine clear or pale yellow. This helps to keep you from becoming dehydrated. Try to drink more clear fluids, such as water.  Do not drink alcohol.  Limit your caffeine intake if told to do so by your health care provider. Check ingredients and nutrition facts to see if a food or beverage contains caffeine.  Limit your salt (sodium) intake if told to do so by your health care provider. Check ingredients and nutrition facts to see if a food or beverage contains sodium. Activity  Avoid making quick movements. ? Rise slowly from chairs and steady yourself until you feel okay. ? In the morning, first sit up on the side of the bed. When you feel okay, stand slowly while you hold onto something until you know that your balance is fine.  If you need to stand in one place for a long time, move your legs often. Tighten and relax the muscles in your legs while you are standing.  Do not drive or use heavy machinery if you feel dizzy.  Avoid bending down if you feel dizzy. Place items in your home so that they are easy for you to reach without leaning over. Lifestyle  Do not use any products that contain nicotine or tobacco, such as cigarettes and e-cigarettes. If you need help quitting, ask your health care provider.  Try to reduce your stress level by using methods such as yoga or meditation. Talk with your health care provider if you need help to manage your stress. General instructions  Watch your dizziness for any changes.  Take over-the-counter and prescription medicines only as told by your health care provider. Talk with your health care provider if you think that your dizziness is caused by a  medicine that you are taking.  Tell a friend or a family member that you are feeling dizzy. If he or she notices any changes in your behavior, have this person call your health care provider.  Keep all follow-up visits as told by your health care provider. This is important. Contact a health care provider if:  Your dizziness does not go away.  Your dizziness or light-headedness gets worse.  You  feel nauseous.  You have reduced hearing.  You have new symptoms.  You are unsteady on your feet or you feel like the room is spinning. Get help right away if:  You vomit or have diarrhea and are unable to eat or drink anything.  You have problems talking, walking, swallowing, or using your arms, hands, or legs.  You feel generally weak.  You are not thinking clearly or you have trouble forming sentences. It may take a friend or family member to notice this.  You have chest pain, abdominal pain, shortness of breath, or sweating.  Your vision changes.  You have any bleeding.  You have a severe headache.  You have neck pain or a stiff neck.  You have a fever. These symptoms may represent a serious problem that is an emergency. Do not wait to see if the symptoms will go away. Get medical help right away. Call your local emergency services (911 in the [place] Do not drive yourself to the hospital. Summary  Dizziness is a feeling of unsteadiness or light-headedness. This condition can be caused by a number of things, including medicines, dehydration, or illness.  Anyone can become dizzy, but dizziness is more common in older adults.  Drink enough fluid to keep your urine clear or pale yellow. Do not drink alcohol.  Avoid making quick movements if you feel dizzy. Monitor your dizziness for any changes. This information is not intended to replace advice given to you by your health care provider. Make sure you discuss any questions you have with your health care provider. Document  Released: 03/26/2001 Document Revised: 11/02/2016 Document Reviewed: 11/02/2016 Elsevier Interactive Patient Education  2018 [employer]   IF you received an x-ray today, you will receive an invoice from [facility]. Please contact [facility] at [phone] with questions or concerns regarding your invoice.   IF you received labwork today, you will receive an invoice from [facility]. Please contact [facility] at [phone] with questions or concerns regarding your invoice.   Our billing staff will not be able to assist you with questions regarding bills from these companies.  You will be contacted with the lab results as soon as they are available. The fastest way to get your results is to activate your My Chart account. Instructions are located on the last page of this paperwork. If you have not heard from us regarding the results in 2 weeks, please contact this office.      I personally performed the services described in this documentation, which was scribed in my presence. The recorded information has been reviewed and considered for accuracy and completeness, addended by me as needed, and agree with information above.  Signed,   Meredith StaggersJeffrey Altariq Goodall, MD [facility] at [facility].  01/19/18 10:26 AM

## 2018-01-19 NOTE — Patient Instructions (Addendum)
Make sure to drink plenty of water throughout the day.  Return the next day or 2 if possible for fasting blood work.  We can temporarily try a lower dose of the blood pressure combination pill, but monitor your blood pressure readings to make sure that it is not going up too high on that dose.   If dizziness spells continue in the next week or two, then I would like you to meet with cardiology.  Let me know and I will place that referral. Return to the clinic or go to the nearest emergency room if any of your symptoms worsen or new symptoms occur.  Dizziness Dizziness is a common problem. It is a feeling of unsteadiness or light-headedness. You may feel like you are about to faint. Dizziness can lead to injury if you stumble or fall. Anyone can become dizzy, but dizziness is more common in older adults. This condition can be caused by a number of things, including medicines, dehydration, or illness. Follow these instructions at home: Eating and drinking  Drink enough fluid to keep your urine clear or pale yellow. This helps to keep you from becoming dehydrated. Try to drink more clear fluids, such as water.  Do not drink alcohol.  Limit your caffeine intake if told to do so by your health care provider. Check ingredients and nutrition facts to see if a food or beverage contains caffeine.  Limit your salt (sodium) intake if told to do so by your health care provider. Check ingredients and nutrition facts to see if a food or beverage contains sodium. Activity  Avoid making quick movements. ? Rise slowly from chairs and steady yourself until you feel okay. ? In the morning, first sit up on the side of the bed. When you feel okay, stand slowly while you hold onto something until you know that your balance is fine.  If you need to stand in one place for a long time, move your legs often. Tighten and relax the muscles in your legs while you are standing.  Do not drive or use heavy machinery if  you feel dizzy.  Avoid bending down if you feel dizzy. Place items in your home so that they are easy for you to reach without leaning over. Lifestyle  Do not use any products that contain nicotine or tobacco, such as cigarettes and e-cigarettes. If you need help quitting, ask your health care provider.  Try to reduce your stress level by using methods such as yoga or meditation. Talk with your health care provider if you need help to manage your stress. General instructions  Watch your dizziness for any changes.  Take over-the-counter and prescription medicines only as told by your health care provider. Talk with your health care provider if you think that your dizziness is caused by a medicine that you are taking.  Tell a friend or a family member that you are feeling dizzy. If he or she notices any changes in your behavior, have this person call your health care provider.  Keep all follow-up visits as told by your health care provider. This is important. Contact a health care provider if:  Your dizziness does not go away.  Your dizziness or light-headedness gets worse.  You feel nauseous.  You have reduced hearing.  You have new symptoms.  You are unsteady on your feet or you feel like the room is spinning. Get help right away if:  You vomit or have diarrhea and are unable to eat or  drink anything.  You have problems talking, walking, swallowing, or using your arms, hands, or legs.  You feel generally weak.  You are not thinking clearly or you have trouble forming sentences. It may take a friend or family member to notice this.  You have chest pain, abdominal pain, shortness of breath, or sweating.  Your vision changes.  You have any bleeding.  You have a severe headache.  You have neck pain or a stiff neck.  You have a fever. These symptoms may represent a serious problem that is an emergency. Do not wait to see if the symptoms will go away. Get medical help  right away. Call your local emergency services (911 in the [place] Do not drive yourself to the hospital. Summary  Dizziness is a feeling of unsteadiness or light-headedness. This condition can be caused by a number of things, including medicines, dehydration, or illness.  Anyone can become dizzy, but dizziness is more common in older adults.  Drink enough fluid to keep your urine clear or pale yellow. Do not drink alcohol.  Avoid making quick movements if you feel dizzy. Monitor your dizziness for any changes. This information is not intended to replace advice given to you by your health care provider. Make sure you discuss any questions you have with your health care provider. Document Released: 03/26/2001 Document Revised: 11/02/2016 Document Reviewed: 11/02/2016 Elsevier Interactive Patient Education  2018 [employer]   IF you received an x-ray today, you will receive an invoice from [facility]. Please contact [facility] at [phone] with questions or concerns regarding your invoice.   IF you received labwork today, you will receive an invoice from [facility]. Please contact [facility] at [phone] with questions or concerns regarding your invoice.   Our billing staff will not be able to assist you with questions regarding bills from these companies.  You will be contacted with the lab results as soon as they are available. The fastest way to get your results is to activate your My Chart account. Instructions are located on the last page of this paperwork. If you have not heard from us regarding the results in 2 weeks, please contact this office.

## 2018-01-20 ENCOUNTER — Ambulatory Visit (INDEPENDENT_AMBULATORY_CARE_PROVIDER_SITE_OTHER): Payer: BLUE CROSS/BLUE SHIELD | Admitting: Family Medicine

## 2018-01-20 DIAGNOSIS — R7989 Other specified abnormal findings of blood chemistry: Secondary | ICD-10-CM

## 2018-01-20 DIAGNOSIS — R42 Dizziness and giddiness: Secondary | ICD-10-CM

## 2018-01-20 DIAGNOSIS — I10 Essential (primary) hypertension: Secondary | ICD-10-CM

## 2018-01-20 DIAGNOSIS — Z130 Encounter for screening for diseases of the blood and blood-forming organs and certain disorders involving the immune mechanism: Secondary | ICD-10-CM

## 2018-01-20 DIAGNOSIS — Z13220 Encounter for screening for lipoid disorders: Secondary | ICD-10-CM

## 2018-01-20 NOTE — Progress Notes (Signed)
Lab Only Visit 

## 2018-01-21 LAB — LIPID PANEL
Chol/HDL Ratio: 1.7 ratio (ref 0.0–4.4)
Cholesterol, Total: 129 mg/dL (ref 100–199)
HDL: 77 mg/dL (ref >39)
LDL Calculated: 39 mg/dL (ref 0–99)
Triglycerides: 67 mg/dL (ref 0–149)
VLDL Cholesterol Cal: 13 mg/dL (ref 5–40)

## 2018-01-21 LAB — COMPREHENSIVE METABOLIC PANEL
A/G RATIO: 1.6 (ref 1.2–2.2)
ALBUMIN: 4.5 g/dL (ref 3.5–5.5)
ALK PHOS: 52 IU/L (ref 39–117)
ALT: 25 IU/L (ref 0–32)
AST: 24 IU/L (ref 0–40)
BILIRUBIN TOTAL: 0.4 mg/dL (ref 0.0–1.2)
BUN / CREAT RATIO: 16 (ref 9–23)
BUN: 18 mg/dL (ref 6–24)
CHLORIDE: 102 mmol/L (ref 96–106)
CO2: 24 mmol/L (ref 20–29)
Calcium: 9.8 mg/dL (ref 8.7–10.2)
Creatinine, Ser: 1.13 mg/dL — ABNORMAL HIGH (ref 0.57–1.00)
GFR calc non Af Amer: 60 mL/min/{1.73_m2} (ref >59)
GFR, EST AFRICAN AMERICAN: 69 mL/min/{1.73_m2} (ref >59)
Globulin, Total: 2.8 g/dL (ref 1.5–4.5)
Glucose: 91 mg/dL (ref 65–99)
POTASSIUM: 3.9 mmol/L (ref 3.5–5.2)
Sodium: 141 mmol/L (ref 134–144)
TOTAL PROTEIN: 7.3 g/dL (ref 6.0–8.5)

## 2018-01-21 LAB — CBC
HEMOGLOBIN: 13.4 g/dL (ref 11.1–15.9)
Hematocrit: 40.3 % (ref 34.0–46.6)
MCH: 29.3 pg (ref 26.6–33.0)
MCHC: 33.3 g/dL (ref 31.5–35.7)
MCV: 88 fL (ref 79–97)
Platelets: 277 10*3/uL (ref 150–379)
RBC: 4.58 x10E6/uL (ref 3.77–5.28)
RDW: 15.5 % — ABNORMAL HIGH (ref 12.3–15.4)
WBC: 3.5 10*3/uL (ref 3.4–10.8)

## 2018-01-27 ENCOUNTER — Encounter: Payer: Self-pay | Admitting: Radiology

## 2018-04-21 ENCOUNTER — Ambulatory Visit (INDEPENDENT_AMBULATORY_CARE_PROVIDER_SITE_OTHER): Payer: 59 | Admitting: Family Medicine

## 2018-04-21 ENCOUNTER — Other Ambulatory Visit: Payer: Self-pay

## 2018-04-21 ENCOUNTER — Encounter: Payer: Self-pay | Admitting: Family Medicine

## 2018-04-21 DIAGNOSIS — I10 Essential (primary) hypertension: Secondary | ICD-10-CM

## 2018-04-21 MED ORDER — LISINOPRIL-HYDROCHLOROTHIAZIDE 10-12.5 MG PO TABS: 1.0000 | ORAL_TABLET | Freq: Every day | ORAL | 2 refills | Status: DC

## 2018-04-21 NOTE — Progress Notes (Signed)
Subjective:  By signing my name below, I, Moises Blood, attest that this documentation has been prepared under the direction and in the presence of Merri Ray, MD. Electronically Signed: Moises Blood, [place]. 04/21/2018 , 4:23 PM .  Patient was seen in Room 2 .   Patient ID: Jessica Moreno, female    DOB: 1976/02/07, 42 y.o.   MRN: [id number] Chief Complaint  Patient presents with  . Medication Refill    Bp   HPI Jessica Moreno is a 42 y.o. female Here for follow up. She has a history of HTN. Last seen in April.   HTN  BP Readings from Last 3 Encounters:  04/21/18 (!) 142/98  01/19/18 128/85  01/06/18 (!) 152/110   Lab Results  Component Value Date   CREATININE 1.13 (H) 01/20/2018   She did report some possible associated dizziness, so decreased lisinopril-HCTZ to 10-12.5 mg with plan for cardiology eval if not improving. Discussed maintaining hydration.   Patient states she's been doing well on the lower dose, but she ran out of medications recently. She denies any lightheadedness or dizziness since. She denies chest pain or shortness of breath. She's been checking her BP at home, which has been staying under 140/90.   With creatinine increased, her eGFR is 69. Her cholesterol in April and screen for anemia looked okay.   Lab Results  Component Value Date   CREATININE 1.13 (H) 01/20/2018   CREATININE 1.05 (H) 01/06/2018    Patient Active Problem List   Diagnosis Date Noted  . Heart murmur 12/28/2016  . Heart murmur, systolic 85/27/7824  . Obesity (BMI 30.0-34.9) 01/12/2015  . Family history of hypertension 01/12/2015  . Family history of cardiovascular disease 01/12/2015  . HTN (hypertension) 02/12/2014   Past Medical History:  Diagnosis Date  . Asthma   . Hypertension    Past Surgical History:  Procedure Laterality Date  . D & C     Allergies  Allergen Reactions  . Penicillins Other (See Comments)    Lesions Has patient had a PCN reaction  causing immediate rash, facial/tongue/throat swelling, SOB or lightheadedness with hypotension: No Has patient had a PCN reaction causing severe rash involving mucus membranes or skin necrosis: No Has patient had a PCN reaction that required hospitalization: No Has patient had a PCN reaction occurring within the last 10 years: No If all of the above answers are "NO", then may proceed with Cephalosporin use.    Prior to Admission medications   Medication Sig Start Date End Date Taking? Authorizing Provider  albuterol (PROVENTIL HFA;VENTOLIN HFA) 108 (90 Base) MCG/ACT inhaler Inhale 2 puffs into the lungs every 4 (four) hours as needed for wheezing or shortness of breath (cough, shortness of breath or wheezing.). 01/06/18  Yes Wendie Agreste, MD  ibuprofen (ADVIL,MOTRIN) 600 MG tablet Take 1 tablet (600 mg total) by mouth every 6 (six) hours as needed. 06/19/17  Yes Varney Biles, MD  lisinopril-hydrochlorothiazide (PRINZIDE,ZESTORETIC) 10-12.5 MG tablet Take 1 tablet by mouth daily. 01/19/18  Yes Wendie Agreste, MD   Social History   Socioeconomic History  . Marital status: Single    Spouse name: Not on file  . Number of children: 1  . Years of education: Not on file  . Highest education level: Not on file  Occupational History  . Not on file  Social Needs  . Financial resource strain: Not on file  . Food insecurity:    Worry: Not on file    Inability: Not  on file  . Transportation needs:    Medical: Not on file    Non-medical: Not on file  Tobacco Use  . Smoking status: Never Smoker  . Smokeless tobacco: Never Used  Substance and Sexual Activity  . Alcohol use: Yes  . Drug use: No  . Sexual activity: Yes    Birth control/protection: Condom  Lifestyle  . Physical activity:    Days per week: Not on file    Minutes per session: Not on file  . Stress: Not on file  Relationships  . Social connections:    Talks on phone: Not on file    Gets together: Not on file     Attends religious service: Not on file    Active member of club or organization: Not on file    Attends meetings of clubs or organizations: Not on file    Relationship status: Not on file  . Intimate partner violence:    Fear of current or ex partner: Not on file    Emotionally abused: Not on file    Physically abused: Not on file    Forced sexual activity: Not on file  Other Topics Concern  . Not on file  Social History Narrative  . Not on file   Review of Systems  Constitutional: Negative for fatigue and unexpected weight change.  Respiratory: Negative for chest tightness and shortness of breath.   Cardiovascular: Negative for chest pain, palpitations and leg swelling.  Gastrointestinal: Negative for abdominal pain and blood in stool.  Neurological: Negative for dizziness, syncope, light-headedness and headaches.       Objective:   Physical Exam  Constitutional: She is oriented to person, place, and time. She appears well-developed and well-nourished.  HENT:  Head: Normocephalic and atraumatic.  Eyes: Pupils are equal, round, and reactive to light. Conjunctivae and EOM are normal.  Neck: Carotid bruit is not present.  Cardiovascular: Normal rate, regular rhythm, normal heart sounds and intact distal pulses.  Pulmonary/Chest: Effort normal and breath sounds normal.  Abdominal: Soft. She exhibits no pulsatile midline mass. There is no tenderness.  Neurological: She is alert and oriented to person, place, and time.  Skin: Skin is warm and dry.  Psychiatric: She has a normal mood and affect. Her behavior is normal.  Vitals reviewed.   Vitals:   04/21/18 1546 04/21/18 1547  BP: (!) 151/96 (!) 142/98  Pulse: 66 66  Temp:  98.4 F (36.9 C)  TempSrc:  Oral  SpO2:  98%  Weight:  243 lb 3.2 oz (110.3 kg)  Height:  _0  (1.803 m)       Assessment & Plan:   Jessica Moreno is a 42 y.o. female Essential hypertension - Plan: lisinopril-hydrochlorothiazide  (PRINZIDE,ZESTORETIC) 10-12.5 MG tablet Tolerating new dose of lisinopril HCTZ.  Home readings have been okay, elevated readings in office off of medication.  Restart lisinopril HCTZ at 10/12.5 mg dose, monitor home readings, and RTC precautions given if elevated or new side effects.  Recheck in 3 months for fasting labs at that time.  Meds ordered this encounter  Medications  . lisinopril-hydrochlorothiazide (PRINZIDE,ZESTORETIC) 10-12.5 MG tablet    Sig: Take 1 tablet by mouth daily.    Dispense:  90 tablet    Refill:  2   Patient Instructions    Make sure you see me on meds, but for now can continue same dose. Keep a record of your blood pressures outside of the office and bring them to the next office visit.  Please follow up in 3 months, and can recheck fasting labs at that time.   IF you received an x-ray today, you will receive an invoice from [facility]. Please contact [facility] at [phone] with questions or concerns regarding your invoice.   IF you received labwork today, you will receive an invoice from [facility]. Please contact [facility] at [phone] with questions or concerns regarding your invoice.   Our billing staff will not be able to assist you with questions regarding bills from these companies.  You will be contacted with the lab results as soon as they are available. The fastest way to get your results is to activate your My Chart account. Instructions are located on the last page of this paperwork. If you have not heard from [place] regarding the results in 2 weeks, please contact this office.        I personally performed the services described in this documentation, which was scribed in my presence. The recorded information has been reviewed and considered for accuracy and completeness, addended by me as needed, and agree with information above.  Signed,   Merri Ray, MD [facility] at [place].   04/22/18 3:17 PM

## 2018-04-21 NOTE — Patient Instructions (Addendum)
  Make sure you see me on meds, but for now can continue same dose. Keep a record of your blood pressures outside of the office and bring them to the next office visit.  Please follow up in 3 months, and can recheck fasting labs at that time.   IF you received an x-ray today, you will receive an invoice from [facility]. Please contact [facility] at [phone] with questions or concerns regarding your invoice.   IF you received labwork today, you will receive an invoice from [facility]. Please contact [facility] at [phone] with questions or concerns regarding your invoice.   Our billing staff will not be able to assist you with questions regarding bills from these companies.  You will be contacted with the lab results as soon as they are available. The fastest way to get your results is to activate your My Chart account. Instructions are located on the last page of this paperwork. If you have not heard from us regarding the results in 2 weeks, please contact this office.

## 2018-04-22 ENCOUNTER — Encounter: Payer: Self-pay | Admitting: Family Medicine

## 2018-07-23 ENCOUNTER — Ambulatory Visit: Payer: 59 | Admitting: Family Medicine

## 2018-08-07 DIAGNOSIS — N760 Acute vaginitis: Secondary | ICD-10-CM | POA: Diagnosis not present

## 2018-08-12 DIAGNOSIS — Z23 Encounter for immunization: Secondary | ICD-10-CM | POA: Diagnosis not present

## 2018-08-18 ENCOUNTER — Telehealth: Payer: Self-pay | Admitting: Family Medicine

## 2018-08-18 NOTE — Telephone Encounter (Signed)
Copied from CRM [id number]. Topic: Quick Communication - Rx Refill/Question >> Aug 18, 2018  1:22 PM Zada Girt, Lumin L wrote: Medication: lisinopril-hydrochlorothiazide (PRINZIDE,ZESTORETIC) 10-12.5 MG tablet (in [place] and forget medication at home)  Has the patient contacted their pharmacy? No. (Agent: If no, request that the patient contact the pharmacy for the refill.) (Agent: If yes, when and what did the pharmacy advise?) out of town  Preferred Pharmacy (with phone number or street name): [id number] - [place], [place] - [address] [place] [address] AT [facility] [address] [place] [place] [address] Phone: [phone] Fax: [phone]  Agent: Please be advised that RX refills may take up to 3 business days. We ask that you follow-up with your pharmacy.

## 2018-08-19 NOTE — Telephone Encounter (Signed)
There are 2 more 90 day refills at pharmacy

## 2018-08-19 NOTE — Telephone Encounter (Signed)
Please advise patient there should be 2 more refills at the pharmacy.  Unable to leave message voicemail  Not set up

## 2018-09-18 ENCOUNTER — Ambulatory Visit (INDEPENDENT_AMBULATORY_CARE_PROVIDER_SITE_OTHER): Payer: 59 | Admitting: Family Medicine

## 2018-09-18 ENCOUNTER — Telehealth: Payer: Self-pay | Admitting: Family Medicine

## 2018-09-18 NOTE — Telephone Encounter (Signed)
Pt. Came in to office on 09/18/18 for visit to refill lisinopril. Was unable to be seen in a time frame convenient to the pt.  The pt. Requested a month refill be issued so that she could have her medication until the rescheduled visit on 10/15/18

## 2018-09-22 ENCOUNTER — Other Ambulatory Visit: Payer: Self-pay | Admitting: Emergency Medicine

## 2018-09-22 DIAGNOSIS — I10 Essential (primary) hypertension: Secondary | ICD-10-CM

## 2018-09-22 MED ORDER — LISINOPRIL-HYDROCHLOROTHIAZIDE 10-12.5 MG PO TABS
1.0000 | ORAL_TABLET | Freq: Every day | ORAL | 0 refills | Status: DC
Start: 2018-09-22 — End: 2018-10-15

## 2018-10-15 ENCOUNTER — Encounter: Payer: Self-pay | Admitting: Family Medicine

## 2018-10-15 ENCOUNTER — Ambulatory Visit (INDEPENDENT_AMBULATORY_CARE_PROVIDER_SITE_OTHER): Payer: 59 | Admitting: Family Medicine

## 2018-10-15 ENCOUNTER — Other Ambulatory Visit: Payer: Self-pay

## 2018-10-15 VITALS — BP 135/88 | HR 70 | Temp 98.5°F | Ht 71.5 in | Wt 249.4 lb

## 2018-10-15 DIAGNOSIS — I10 Essential (primary) hypertension: Secondary | ICD-10-CM | POA: Diagnosis not present

## 2018-10-15 DIAGNOSIS — Z6834 Body mass index (BMI) 34.0-34.9, adult: Secondary | ICD-10-CM | POA: Diagnosis not present

## 2018-10-15 DIAGNOSIS — R062 Wheezing: Secondary | ICD-10-CM | POA: Diagnosis not present

## 2018-10-15 MED ORDER — LISINOPRIL-HYDROCHLOROTHIAZIDE 10-12.5 MG PO TABS: 1.0000 | ORAL_TABLET | Freq: Every day | ORAL | 1 refills | Status: DC

## 2018-10-15 MED ORDER — ALBUTEROL SULFATE HFA 108 (90 BASE) MCG/ACT IN AERS: 2.0000 | INHALATION_SPRAY | RESPIRATORY_TRACT | 0 refills | Status: DC | PRN

## 2018-10-15 NOTE — Addendum Note (Signed)
Addended by: Meredith StaggersGREENE, Rama Mcclintock R on: 10/15/2018 05:09 PM   Modules accepted: Orders

## 2018-10-15 NOTE — Patient Instructions (Addendum)
Blood pressure looks good today.  If you would like to change to a different class of medication than lisinopril, let me know.  No change in doses for now.  With family history of stroke, baby aspirin once a day may be reasonable, but will discuss any other testing with your family's neurologist.  Cholesterol testing in 3 months.  Good luck with the diet/exercise changes and let me know if I can help.  Thank you for coming in today.  If you have lab work done today you will be contacted with your lab results within the next 2 weeks.  If you have not heard from [place] then please contact us. The fastest way to get your results is to register for My Chart.   IF you received an x-ray today, you will receive an invoice from [facility]. Please contact [facility] at [phone] with questions or concerns regarding your invoice.   IF you received labwork today, you will receive an invoice from [facility]. Please contact [facility] at [phone] with questions or concerns regarding your invoice.   Our billing staff will not be able to assist you with questions regarding bills from these companies.  You will be contacted with the lab results as soon as they are available. The fastest way to get your results is to activate your My Chart account. Instructions are located on the last page of this paperwork. If you have not heard from [place] regarding the results in 2 weeks, please contact this office.

## 2018-10-15 NOTE — Progress Notes (Signed)
Subjective:    Patient ID: Jessica Moreno, female    DOB: 03/28/1976, 43 y.o.   MRN: [id number]  HPI Jessica Moreno is a 43 y.o. female Presents today for: Chief Complaint  Patient presents with  . Medication question    lisinopril   . Hypertension    BP   Hypertension: BP Readings from Last 3 Encounters:  10/15/18 135/88  04/21/18 (!) 142/98  01/19/18 128/85   Lab Results  Component Value Date   CREATININE 1.13 (H) 01/20/2018  Currently on lisinopril HCT 10/12.5 mg daily.  Had been off medication at last office visit.    Overweight/obesity: Wt Readings from Last 3 Encounters:  10/15/18 249 lb 6.4 oz (113.1 kg)  04/21/18 243 lb 3.2 oz (110.3 kg)  01/19/18 229 lb 9.6 oz (104.1 kg)   Body mass index is 34.3 kg/m. Not much exercise at present.  Has coworkers that are working out, [employer]. Fast food at times.  Plans on avoiding eating out during January.    Patient Active Problem List   Diagnosis Date Noted  . Heart murmur 12/28/2016  . Heart murmur, systolic 12/28/2016  . Obesity (BMI 30.0-34.9) 01/12/2015  . Family history of hypertension 01/12/2015  . Family history of cardiovascular disease 01/12/2015  . HTN (hypertension) 02/12/2014   Past Medical History:  Diagnosis Date  . Asthma   . Hypertension    Past Surgical History:  Procedure Laterality Date  . D & C     Allergies  Allergen Reactions  . Penicillins Other (See Comments)    Lesions Has patient had a PCN reaction causing immediate rash, facial/tongue/throat swelling, SOB or lightheadedness with hypotension: No Has patient had a PCN reaction causing severe rash involving mucus membranes or skin necrosis: No Has patient had a PCN reaction that required hospitalization: No Has patient had a PCN reaction occurring within the last 10 years: No If all of the above answers are "NO", then may proceed with Cephalosporin use.    Prior to Admission medications   Medication Sig Start Date End  Date Taking? Authorizing Provider  albuterol (PROVENTIL HFA;VENTOLIN HFA) 108 (90 Base) MCG/ACT inhaler Inhale 2 puffs into the lungs every 4 (four) hours as needed for wheezing or shortness of breath (cough, shortness of breath or wheezing.). 01/06/18  Yes Shade FloodGreene, Neila Teem R, MD  lisinopril-hydrochlorothiazide (PRINZIDE,ZESTORETIC) 10-12.5 MG tablet Take 1 tablet by mouth daily. Patient not taking: Reported on 10/15/2018 09/22/18   Shade FloodGreene, Rolonda Pontarelli R, MD   Social History   Socioeconomic History  . Marital status: Single    Spouse name: Not on file  . Number of children: 1  . Years of education: Not on file  . Highest education level: Not on file  Occupational History  . Not on file  Social Needs  . Financial resource strain: Not on file  . Food insecurity:    Worry: Not on file    Inability: Not on file  . Transportation needs:    Medical: Not on file    Non-medical: Not on file  Tobacco Use  . Smoking status: Never Smoker  . Smokeless tobacco: Never Used  Substance and Sexual Activity  . Alcohol use: Yes  . Drug use: No  . Sexual activity: Yes    Birth control/protection: Condom  Lifestyle  . Physical activity:    Days per week: Not on file    Minutes per session: Not on file  . Stress: Not on file  Relationships  . Social  connections:    Talks on phone: Not on file    Gets together: Not on file    Attends religious service: Not on file    Active member of club or organization: Not on file    Attends meetings of clubs or organizations: Not on file    Relationship status: Not on file  . Intimate partner violence:    Fear of current or ex partner: Not on file    Emotionally abused: Not on file    Physically abused: Not on file    Forced sexual activity: Not on file  Other Topics Concern  . Not on file  Social History Narrative  . Not on file    Review of Systems  Constitutional: Negative for fatigue and unexpected weight change.  Respiratory: Negative for chest  tightness and shortness of breath.   Cardiovascular: Negative for chest pain, palpitations and leg swelling.  Gastrointestinal: Negative for abdominal pain and blood in stool.  Neurological: Negative for dizziness, syncope, light-headedness and headaches.       Objective:   Physical Exam Vitals signs reviewed.  Constitutional:      Appearance: She is well-developed.  HENT:     Head: Normocephalic and atraumatic.  Eyes:     Conjunctiva/sclera: Conjunctivae normal.     Pupils: Pupils are equal, round, and reactive to light.  Neck:     Vascular: No carotid bruit.  Cardiovascular:     Rate and Rhythm: Normal rate and regular rhythm.     Heart sounds: Normal heart sounds.  Pulmonary:     Effort: Pulmonary effort is normal.     Breath sounds: Normal breath sounds.  Abdominal:     Palpations: Abdomen is soft. There is no pulsatile mass.     Tenderness: There is no abdominal tenderness.  Skin:    General: Skin is warm and dry.  Neurological:     Mental Status: She is alert and oriented to person, place, and time.  Psychiatric:        Behavior: Behavior normal.    Vitals:   10/15/18 1345 10/15/18 1350  BP: (!) 150/94 135/88  Pulse: 74 70  Temp: 98.5 F (36.9 C)   SpO2: 95%       Assessment & Plan:  Jessica Moreno is a 43 y.o. female Essential hypertension - Plan: Basic metabolic panel, TSH  -Stable.  Questions discussed regarding ACE inhibitors and risk of angioedema.  Other options were discussed but ultimately since she is tolerating current regimen without side effects decided to remain on lisinopril HCT.  Option of change to ARB or other classes if she would like.  Check renal function and slightly elevated previously.  BMI 34.0-34.9,adult  -Diet/exercise discussed and plans on weight loss before next visit in 3 months.  Plan on cholesterol/fasting labs at that time.  Family history of stroke with cousins and grandmother.  Baby aspirin once per day may be reasonable  with risks discussed of bleeding.  Can also discuss recommendations with her family's treating neurologist  No orders of the defined types were placed in this encounter.  Patient Instructions   Blood pressure looks good today.  If you would like to change to a different class of medication than lisinopril, let me know.  No change in doses for now.  With family history of stroke, baby aspirin once a day may be reasonable, but will discuss any other testing with your family's neurologist.  Cholesterol testing in 3 months.  Good luck with  the diet/exercise changes and let me know if I can help.  Thank you for coming in today.  If you have lab work done today you will be contacted with your lab results within the next 2 weeks.  If you have not heard from [place] then please contact us. The fastest way to get your results is to register for My Chart.   IF you received an x-ray today, you will receive an invoice from [facility]. Please contact [facility] at [phone] with questions or concerns regarding your invoice.   IF you received labwork today, you will receive an invoice from [facility]. Please contact [facility] at [phone] with questions or concerns regarding your invoice.   Our billing staff will not be able to assist you with questions regarding bills from these companies.  You will be contacted with the lab results as soon as they are available. The fastest way to get your results is to activate your My Chart account. Instructions are located on the last page of this paperwork. If you have not heard from [place] regarding the results in 2 weeks, please contact this office.       Signed,   Meredith Staggers, MD Primary Care at [facility].  10/15/18 2:31 PM

## 2018-10-16 LAB — BASIC METABOLIC PANEL
BUN / CREAT RATIO: 15 (ref 9–23)
BUN: 15 mg/dL (ref 6–24)
CO2: 24 mmol/L (ref 20–29)
CREATININE: 0.97 mg/dL (ref 0.57–1.00)
Calcium: 9.2 mg/dL (ref 8.7–10.2)
Chloride: 100 mmol/L (ref 96–106)
GFR, EST AFRICAN AMERICAN: 82 mL/min/{1.73_m2} (ref >59)
GFR, EST NON AFRICAN AMERICAN: 71 mL/min/{1.73_m2} (ref >59)
Glucose: 89 mg/dL (ref 65–99)
POTASSIUM: 4.4 mmol/L (ref 3.5–5.2)
SODIUM: 138 mmol/L (ref 134–144)

## 2018-10-16 LAB — TSH: TSH: 2.13 u[IU]/mL (ref 0.450–4.500)

## 2018-10-22 NOTE — Progress Notes (Signed)
Letter  Mailed out

## 2018-12-02 ENCOUNTER — Telehealth: Payer: Self-pay | Admitting: Family Medicine

## 2018-12-02 NOTE — Telephone Encounter (Signed)
Tried to call the pt and reschedule for the cancelled appt 4/2 with Dr. Neva Seat. Due to Dr. Neva Seat being out of the office this day, pt needs to be rescheduled. I was not able to leave a message due to the VM being full and no MyChart. If/When pt calls back, please reschedule this appt with Dr. Neva Seat at their convenience.

## 2018-12-09 DIAGNOSIS — Z1159 Encounter for screening for other viral diseases: Secondary | ICD-10-CM | POA: Diagnosis not present

## 2018-12-09 DIAGNOSIS — N760 Acute vaginitis: Secondary | ICD-10-CM | POA: Diagnosis not present

## 2018-12-09 DIAGNOSIS — Z118 Encounter for screening for other infectious and parasitic diseases: Secondary | ICD-10-CM | POA: Diagnosis not present

## 2019-01-14 ENCOUNTER — Ambulatory Visit: Payer: 59 | Admitting: Family Medicine

## 2019-03-05 DIAGNOSIS — Z1159 Encounter for screening for other viral diseases: Secondary | ICD-10-CM | POA: Diagnosis not present

## 2019-05-13 ENCOUNTER — Other Ambulatory Visit: Payer: Self-pay | Admitting: *Deleted

## 2019-05-13 ENCOUNTER — Telehealth: Payer: Self-pay | Admitting: Family Medicine

## 2019-05-13 DIAGNOSIS — I10 Essential (primary) hypertension: Secondary | ICD-10-CM

## 2019-05-13 MED ORDER — LISINOPRIL-HYDROCHLOROTHIAZIDE 10-12.5 MG PO TABS: 1.0000 | ORAL_TABLET | Freq: Every day | ORAL | 0 refills | Status: DC

## 2019-05-13 NOTE — Telephone Encounter (Signed)
Pt needs a 7 day supply of Lisinopril to last until her upcoming appt on 05/20/2019. Pt took last pill today.

## 2019-05-13 NOTE — Telephone Encounter (Signed)
Patient was called and advised that a 30 day supply of Lisinopril was sent to her pharmacy.  Patient verbalized understanding.

## 2019-05-20 ENCOUNTER — Ambulatory Visit: Payer: 59 | Admitting: Family Medicine

## 2019-05-21 ENCOUNTER — Encounter: Payer: Self-pay | Admitting: Family Medicine

## 2019-07-01 ENCOUNTER — Ambulatory Visit (INDEPENDENT_AMBULATORY_CARE_PROVIDER_SITE_OTHER): Payer: 59 | Admitting: Family Medicine

## 2019-07-01 ENCOUNTER — Other Ambulatory Visit: Payer: Self-pay

## 2019-07-01 ENCOUNTER — Encounter: Payer: Self-pay | Admitting: Family Medicine

## 2019-07-01 VITALS — BP 131/84 | HR 72 | Temp 98.5°F | Resp 14 | Wt 241.0 lb

## 2019-07-01 DIAGNOSIS — Z13220 Encounter for screening for lipoid disorders: Secondary | ICD-10-CM

## 2019-07-01 DIAGNOSIS — J4520 Mild intermittent asthma, uncomplicated: Secondary | ICD-10-CM | POA: Diagnosis not present

## 2019-07-01 DIAGNOSIS — R062 Wheezing: Secondary | ICD-10-CM

## 2019-07-01 DIAGNOSIS — I10 Essential (primary) hypertension: Secondary | ICD-10-CM | POA: Diagnosis not present

## 2019-07-01 MED ORDER — ALBUTEROL SULFATE HFA 108 (90 BASE) MCG/ACT IN AERS: 1.0000 | INHALATION_SPRAY | RESPIRATORY_TRACT | 0 refills | Status: DC | PRN

## 2019-07-01 MED ORDER — LISINOPRIL-HYDROCHLOROTHIAZIDE 10-12.5 MG PO TABS: 1.0000 | ORAL_TABLET | Freq: Every day | ORAL | 2 refills | Status: DC

## 2019-07-01 MED ORDER — ALBUTEROL SULFATE HFA 108 (90 BASE) MCG/ACT IN AERS: 1.0000 | INHALATION_SPRAY | RESPIRATORY_TRACT | 1 refills | Status: AC | PRN

## 2019-07-01 NOTE — Progress Notes (Signed)
Subjective:    Patient ID: Jessica Moreno, female    DOB: [birth year], 43 y.o.   MRN: [id number]  HPI Jessica Moreno is a 43 y.o. female Presents today for: Chief Complaint  Patient presents with  . Hypertension    Patient states her bp has been running good at home. Will need a refill on her lisinopril and inhaler    Hypertension: BP Readings from Last 3 Encounters:  07/01/19 131/84  10/15/18 135/88  04/21/18 (!) 142/98   Lab Results  Component Value Date   CREATININE 0.97 10/15/2018  increased exercise. Improved Home readings: 120/80.  Out of meds past 4 days. Tolerating current meds, no new side effects.   Asthma; Mild intermiitent. Notes more with humidity and with wearing mask - feels some more wheezing with exercise. Not everytime. Depends in humidity - 1 puff works.  No nighttime symptoms.    Patient Active Problem List   Diagnosis Date Noted  . Heart murmur 12/28/2016  . Heart murmur, systolic 12/28/2016  . Obesity (BMI 30.0-34.9) 01/12/2015  . Family history of hypertension 01/12/2015  . Family history of cardiovascular disease 01/12/2015  . HTN (hypertension) 02/12/2014   Past Medical History:  Diagnosis Date  . Asthma   . Hypertension    Past Surgical History:  Procedure Laterality Date  . D & C     Allergies  Allergen Reactions  . Penicillins Other (See Comments)    Lesions Has patient had a PCN reaction causing immediate rash, facial/tongue/throat swelling, SOB or lightheadedness with hypotension: No Has patient had a PCN reaction causing severe rash involving mucus membranes or skin necrosis: No Has patient had a PCN reaction that required hospitalization: No Has patient had a PCN reaction occurring within the last 10 years: No If all of the above answers are "NO", then may proceed with Cephalosporin use.    Prior to Admission medications   Medication Sig Start Date End Date Taking? Authorizing Provider  albuterol (PROVENTIL  HFA;VENTOLIN HFA) 108 (90 Base) MCG/ACT inhaler Inhale 2 puffs into the lungs every 4 (four) hours as needed for wheezing or shortness of breath (cough, shortness of breath or wheezing.). 10/15/18  Yes Shade Flood, MD  lisinopril-hydrochlorothiazide (ZESTORETIC) 10-12.5 MG tablet Take 1 tablet by mouth daily. 05/13/19  Yes Shade Flood, MD   Social History   Socioeconomic History  . Marital status: Single    Spouse name: Not on file  . Number of children: 1  . Years of education: Not on file  . Highest education level: Not on file  Occupational History  . Not on file  Social Needs  . Financial resource strain: Not on file  . Food insecurity    Worry: Not on file    Inability: Not on file  . Transportation needs    Medical: Not on file    Non-medical: Not on file  Tobacco Use  . Smoking status: Never Smoker  . Smokeless tobacco: Never Used  Substance and Sexual Activity  . Alcohol use: Yes  . Drug use: No  . Sexual activity: Yes    Birth control/protection: Condom  Lifestyle  . Physical activity    Days per week: Not on file    Minutes per session: Not on file  . Stress: Not on file  Relationships  . Social Musician on phone: Not on file    Gets together: Not on file    Attends religious service: Not on file  Active member of club or organization: Not on file    Attends meetings of clubs or organizations: Not on file    Relationship status: Not on file  . Intimate partner violence    Fear of current or ex partner: Not on file    Emotionally abused: Not on file    Physically abused: Not on file    Forced sexual activity: Not on file  Other Topics Concern  . Not on file  Social History Narrative  . Not on file    Review of Systems  Constitutional: Negative for fatigue and unexpected weight change.  Respiratory: Negative for chest tightness and shortness of breath.   Cardiovascular: Negative for chest pain, palpitations and leg swelling.   Gastrointestinal: Negative for abdominal pain and blood in stool.  Neurological: Negative for dizziness, syncope, light-headedness and headaches.       Objective:   Physical Exam Vitals signs reviewed.  Constitutional:      Appearance: She is well-developed.  HENT:     Head: Normocephalic and atraumatic.  Eyes:     Conjunctiva/sclera: Conjunctivae normal.     Pupils: Pupils are equal, round, and reactive to light.  Neck:     Vascular: No carotid bruit.  Cardiovascular:     Rate and Rhythm: Normal rate and regular rhythm.     Heart sounds: Normal heart sounds.  Pulmonary:     Effort: Pulmonary effort is normal.     Breath sounds: Normal breath sounds.  Abdominal:     Palpations: Abdomen is soft. There is no pulsatile mass.     Tenderness: There is no abdominal tenderness.  Skin:    General: Skin is warm and dry.  Neurological:     Mental Status: She is alert and oriented to person, place, and time.  Psychiatric:        Behavior: Behavior normal.    Vitals:   07/01/19 1524  BP: 131/84  Pulse: 72  Resp: 14  Temp: 98.5 F (36.9 C)  TempSrc: Oral  SpO2: 98%  Weight: 241 lb (109.3 kg)        Assessment & Plan:   Jessica Moreno is a 43 y.o. female Essential hypertension - Plan: lisinopril-hydrochlorothiazide (ZESTORETIC) 10-12.5 MG tablet  -Stable at current dose, potentially may require less medication with decreasing weight.  Monitor home readings and RTC precautions.  Labs pending  Mild intermittent asthma without complication Wheezing - Plan: albuterol (VENTOLIN HFA) 108 (90 Base) MCG/ACT inhaler, DISCONTINUED: albuterol (VENTOLIN HFA) 108 (90 Base) MCG/ACT inhaler  -Mild intermittent asthma by history, albuterol for now but option of inhaled corticosteroid if more frequent need.  Screening for hyperlipidemia - Plan: Comprehensive metabolic panel, Lipid Panel   Meds ordered this encounter  Medications  . DISCONTD: albuterol (VENTOLIN HFA) 108 (90 Base)  MCG/ACT inhaler    Sig: Inhale 1-2 puffs into the lungs every 4 (four) hours as needed for wheezing or shortness of breath (cough, shortness of breath or wheezing.).    Dispense:  18 g    Refill:  0  . lisinopril-hydrochlorothiazide (ZESTORETIC) 10-12.5 MG tablet    Sig: Take 1 tablet by mouth daily.    Dispense:  90 tablet    Refill:  2  . albuterol (VENTOLIN HFA) 108 (90 Base) MCG/ACT inhaler    Sig: Inhale 1-2 puffs into the lungs every 4 (four) hours as needed for wheezing or shortness of breath (cough, shortness of breath or wheezing.).    Dispense:  18 g  Refill:  1   Patient Instructions    If frequent need for albuterol inhlaer - would recomend flovent or inhaled steroid inhaler. Follow up if this is the case.   No change in meds today, but if pressures run lower - may need to adjust meds as well.   Recheck in 6 months.   If you have lab work done today you will be contacted with your lab results within the next 2 weeks.  If you have not heard from us then please contact us. The fastest way to get your results is to register for My Chart.   IF you received an x-ray today, you will receive an invoice [facility]. Please contact [facility] at [phone] with questions or concerns regarding your invoice.   IF you received labwork today, you will receive an invoice from [facility]. Please contact [facility] at [phone] with questions or concerns regarding your invoice.   Our billing staff will not be able to assist you with questions regarding bills from these companies.  You will be contacted with the lab results as soon as they are available. The fastest way to get your results is to activate your My Chart account. Instructions are located on the last page of this paperwork. If you have not heard from us regarding the results in 2 weeks, please contact this office.       Signed,   Meredith StaggersJeffrey Eyonna Sandstrom, MD [facility] at [facility].  07/02/19 10:38 AM

## 2019-07-01 NOTE — Patient Instructions (Addendum)
  If frequent need for albuterol inhlaer - would recomend flovent or inhaled steroid inhaler. Follow up if this is the case.   No change in meds today, but if pressures run lower - may need to adjust meds as well.   Recheck in 6 months.   If you have lab work done today you will be contacted with your lab results within the next 2 weeks.  If you have not heard from [place] then please contact us. The fastest way to get your results is to register for My Chart.   IF you received an x-ray today, you will receive an invoice from [facility]. Please contact [facility] at [phone] with questions or concerns regarding your invoice.   IF you received labwork today, you will receive an invoice from [facility]. Please contact [facility] at [phone] with questions or concerns regarding your invoice.   Our billing staff will not be able to assist you with questions regarding bills from these companies.  You will be contacted with the lab results as soon as they are available. The fastest way to get your results is to activate your My Chart account. Instructions are located on the last page of this paperwork. If you have not heard from [place] regarding the results in 2 weeks, please contact this office.

## 2019-07-02 ENCOUNTER — Encounter: Payer: Self-pay | Admitting: Family Medicine

## 2019-07-02 LAB — COMPREHENSIVE METABOLIC PANEL
ALT: 13 IU/L (ref 0–32)
AST: 18 IU/L (ref 0–40)
Albumin/Globulin Ratio: 1.6 (ref 1.2–2.2)
Albumin: 4.1 g/dL (ref 3.8–4.8)
Alkaline Phosphatase: 52 IU/L (ref 39–117)
BUN/Creatinine Ratio: 14 (ref 9–23)
BUN: 16 mg/dL (ref 6–24)
Bilirubin Total: 0.4 mg/dL (ref 0.0–1.2)
CO2: 24 mmol/L (ref 20–29)
Calcium: 9.2 mg/dL (ref 8.7–10.2)
Chloride: 103 mmol/L (ref 96–106)
Creatinine, Ser: 1.11 mg/dL — ABNORMAL HIGH (ref 0.57–1.00)
GFR calc Af Amer: 69 mL/min/{1.73_m2} (ref >59)
GFR calc non Af Amer: 60 mL/min/{1.73_m2} (ref >59)
Globulin, Total: 2.6 g/dL (ref 1.5–4.5)
Glucose: 82 mg/dL (ref 65–99)
Potassium: 4.2 mmol/L (ref 3.5–5.2)
Sodium: 139 mmol/L (ref 134–144)
Total Protein: 6.7 g/dL (ref 6.0–8.5)

## 2019-07-02 LAB — LIPID PANEL
Chol/HDL Ratio: 1.4 ratio (ref 0.0–4.4)
Cholesterol, Total: 115 mg/dL (ref 100–199)
HDL: 80 mg/dL (ref >39)
LDL Chol Calc (NIH): 23 mg/dL (ref 0–99)
Triglycerides: 46 mg/dL (ref 0–149)
VLDL Cholesterol Cal: 12 mg/dL (ref 5–40)

## 2019-07-06 ENCOUNTER — Encounter: Payer: Self-pay | Admitting: Radiology

## 2019-11-26 ENCOUNTER — Other Ambulatory Visit: Payer: Self-pay

## 2019-11-26 ENCOUNTER — Ambulatory Visit (INDEPENDENT_AMBULATORY_CARE_PROVIDER_SITE_OTHER): Payer: 59 | Admitting: Adult Health Nurse Practitioner

## 2019-11-26 ENCOUNTER — Encounter: Payer: Self-pay | Admitting: Adult Health Nurse Practitioner

## 2019-11-26 VITALS — BP 131/92 | HR 75 | Temp 98.0°F | Ht 71.0 in | Wt 236.8 lb

## 2019-11-26 DIAGNOSIS — M549 Dorsalgia, unspecified: Secondary | ICD-10-CM | POA: Insufficient documentation

## 2019-11-26 DIAGNOSIS — Z23 Encounter for immunization: Secondary | ICD-10-CM | POA: Diagnosis not present

## 2019-11-26 LAB — POCT URINALYSIS DIP (MANUAL ENTRY)
Bilirubin, UA: NEGATIVE
Blood, UA: NEGATIVE
Glucose, UA: NEGATIVE mg/dL
Ketones, POC UA: NEGATIVE mg/dL
Leukocytes, UA: NEGATIVE
Nitrite, UA: NEGATIVE
Protein Ur, POC: NEGATIVE mg/dL
Spec Grav, UA: 1.025 (ref 1.010–1.025)
Urobilinogen, UA: 0.2 E.U./dL
pH, UA: 7 (ref 5.0–8.0)

## 2019-11-26 MED ORDER — METHYLPREDNISOLONE 4 MG PO TBPK: ORAL_TABLET | ORAL | 0 refills | Status: DC

## 2019-11-26 NOTE — Patient Instructions (Addendum)
If you have lab work done today you will be contacted with your lab results within the next 2 weeks.  If you have not heard from [place] then please contact us. The fastest way to get your results is to register for My Chart.   IF you received an x-ray today, you will receive an invoice from [facility]. Please contact [facility] at [phone] with questions or concerns regarding your invoice.   IF you received labwork today, you will receive an invoice from [place]. Please contact [facility] at [phone] with questions or concerns regarding your invoice.   Our billing staff will not be able to assist you with questions regarding bills from these companies.  You will be contacted with the lab results as soon as they are available. The fastest way to get your results is to activate your My Chart account. Instructions are located on the last page of this paperwork. If you have not heard from [place] regarding the results in 2 weeks, please contact this office.     Muscle Cramps and Spasms Muscle cramps and spasms occur when a muscle or muscles tighten and you have no control over this tightening (involuntary muscle contraction). They are a common problem and can develop in any muscle. The most common place is in the calf muscles of the leg. Muscle cramps and muscle spasms are both involuntary muscle contractions, but there are some differences between the two:  Muscle cramps are painful. They come and go and may last for a few seconds or up to 15 minutes. Muscle cramps are often more forceful and last longer than muscle spasms.  Muscle spasms may or may not be painful. They may also last just a few seconds or much longer. Certain medical conditions, such as diabetes or Parkinson's disease, can make it more likely to develop cramps or spasms. However, cramps or spasms are usually not caused by a serious underlying problem. Common causes include:  Doing more physical work or  exercise than your body is ready for (overexertion).  Overuse from repeating certain movements too many times.  Remaining in a certain position for a long period of time.  Improper preparation, form, or technique while playing a sport or doing an activity.  Dehydration.  Injury.  Side effects of some medicines.  Abnormally low levels of the salts and minerals in your blood (electrolytes), especially potassium and calcium. This could happen if you are taking water pills (diuretics) or if you are pregnant. In many cases, the cause of muscle cramps or spasms is not known. Follow these instructions at home: Managing pain and stiffness      Try massaging, stretching, and relaxing the affected muscle. Do this for several minutes at a time.  If directed, apply heat to tight or tense muscles as often as told by your health care provider. Use the heat source that your health care provider recommends, such as a moist heat pack or a heating pad. ? Place a towel between your skin and the heat source. ? Leave the heat on for 20-30 minutes. ? Remove the heat if your skin turns bright red. This is especially important if you are unable to feel pain, heat, or cold. You may have a greater risk of getting burned.  If directed, put ice on the affected area. This may help if you are sore or have pain after a cramp or spasm. ? Put ice in a plastic bag. ? Place a towel between your  skin and the bag. ? Leavethe ice on for 20 minutes, 2-3 times a day.  Try taking hot showers or baths to help relax tight muscles. Eating and drinking  Drink enough fluid to keep your urine pale yellow. Staying well hydrated may help prevent cramps or spasms.  Eat a healthy diet that includes plenty of nutrients to help your muscles function. A healthy diet includes fruits and vegetables, lean protein, whole grains, and low-fat or nonfat dairy products. General instructions  If you are having frequent cramps, avoid  intense exercise for several days.  Take over-the-counter and prescription medicines only as told by your health care provider.  Pay attention to any changes in your symptoms.  Keep all follow-up visits as told by your health care provider. This is important. Contact a health care provider if:  Your cramps or spasms get more severe or happen more often.  Your cramps or spasms do not improve over time. Summary  Muscle cramps and spasms occur when a muscle or muscles tighten and you have no control over this tightening (involuntary muscle contraction).  The most common place for cramps or spasms to occur is in the calf muscles of the leg.  Massaging, stretching, and relaxing the affected muscle may relieve the cramp or spasm.  Drink enough fluid to keep your urine pale yellow. Staying well hydrated may help prevent cramps or spasms. This information is not intended to replace advice given to you by your health care provider. Make sure you discuss any questions you have with your health care provider. Document Revised: 02/23/2018 Document Reviewed: 02/23/2018 Elsevier Patient Education  2020 [employer] Inc[employer]    Back Exercises The following exercises strengthen the muscles that help to support the trunk and back. They also help to keep the lower back flexible. Doing these exercises can help to prevent back pain or lessen existing pain.  If you have back pain or discomfort, try doing these exercises 2-3 times each day or as told by your health care provider.  As your pain improves, do them once each day, but increase the number of times that you repeat the steps for each exercise (do more repetitions).  To prevent the recurrence of back pain, continue to do these exercises once each day or as told by your health care provider. Do exercises exactly as told by your health care provider and adjust them as directed. It is normal to feel mild stretching, pulling, tightness, or discomfort as  you do these exercises, but you should stop right away if you feel sudden pain or your pain gets worse. Exercises Single knee to chest Repeat these steps 3-5 times for each leg: 1. Lie on your back on a firm bed or the floor with your legs extended. 2. Bring one knee to your chest. Your other leg should stay extended and in contact with the floor. 3. Hold your knee in place by grabbing your knee or thigh with both hands and hold. 4. Pull on your knee until you feel a gentle stretch in your lower back or buttocks. 5. Hold the stretch for 10-30 seconds. 6. Slowly release and straighten your leg. Pelvic tilt Repeat these steps 5-10 times: 1. Lie on your back on a firm bed or the floor with your legs extended. 2. Bend your knees so they are pointing toward the ceiling and your feet are flat on the floor. 3. Tighten your lower abdominal muscles to press your lower back against the floor. This motion will  tilt your pelvis so your tailbone points up toward the ceiling instead of pointing to your feet or the floor. 4. With gentle tension and even breathing, hold this position for 5-10 seconds. Cat-cow Repeat these steps until your lower back becomes more flexible: 1. Get into a hands-and-knees position on a firm surface. Keep your hands under your shoulders, and keep your knees under your hips. You may place padding under your knees for comfort. 2. Let your head hang down toward your chest. Contract your abdominal muscles and point your tailbone toward the floor so your lower back becomes rounded like the back of a cat. 3. Hold this position for 5 seconds. 4. Slowly lift your head, let your abdominal muscles relax and point your tailbone up toward the ceiling so your back forms a sagging arch like the back of a cow. 5. Hold this position for 5 seconds.  Press-ups Repeat these steps 5-10 times: 1. Lie on your abdomen (face-down) on the floor. 2. Place your palms near your head, about shoulder-width  apart. 3. Keeping your back as relaxed as possible and keeping your hips on the floor, slowly straighten your arms to raise the top half of your body and lift your shoulders. Do not use your back muscles to raise your upper torso. You may adjust the placement of your hands to make yourself more comfortable. 4. Hold this position for 5 seconds while you keep your back relaxed. 5. Slowly return to lying flat on the floor.  Bridges Repeat these steps 10 times: 1. Lie on your back on a firm surface. 2. Bend your knees so they are pointing toward the ceiling and your feet are flat on the floor. Your arms should be flat at your sides, next to your body. 3. Tighten your buttocks muscles and lift your buttocks off the floor until your waist is at almost the same height as your knees. You should feel the muscles working in your buttocks and the back of your thighs. If you do not feel these muscles, slide your feet 1-2 inches farther away from your buttocks. 4. Hold this position for 3-5 seconds. 5. Slowly lower your hips to the starting position, and allow your buttocks muscles to relax completely. If this exercise is too easy, try doing it with your arms crossed over your chest. Abdominal crunches Repeat these steps 5-10 times: 1. Lie on your back on a firm bed or the floor with your legs extended. 2. Bend your knees so they are pointing toward the ceiling and your feet are flat on the floor. 3. Cross your arms over your chest. 4. Tip your chin slightly toward your chest without bending your neck. 5. Tighten your abdominal muscles and slowly raise your trunk (torso) high enough to lift your shoulder blades a tiny bit off the floor. Avoid raising your torso higher than that because it can put too much stress on your low back and does not help to strengthen your abdominal muscles. 6. Slowly return to your starting position. Back lifts Repeat these steps 5-10 times: 1. Lie on your abdomen (face-down)  with your arms at your sides, and rest your forehead on the floor. 2. Tighten the muscles in your legs and your buttocks. 3. Slowly lift your chest off the floor while you keep your hips pressed to the floor. Keep the back of your head in line with the curve in your back. Your eyes should be looking at the floor. 4. Hold this position for  3-5 seconds. 5. Slowly return to your starting position. Contact a health care provider if:  Your back pain or discomfort gets much worse when you do an exercise.  Your worsening back pain or discomfort does not lessen within 2 hours after you exercise. If you have any of these problems, stop doing these exercises right away. Do not do them again unless your health care provider says that you can. Get help right away if:  You develop sudden, severe back pain. If this happens, stop doing the exercises right away. Do not do them again unless your health care provider says that you can. This information is not intended to replace advice given to you by your health care provider. Make sure you discuss any questions you have with your health care provider. Document Revised: 02/04/2019 Document Reviewed: 07/02/2018 Elsevier Patient Education  2020 Elsevier Inc.  

## 2019-11-26 NOTE — Progress Notes (Signed)
SUBJECTIVE:  Jessica Moreno is a 44 y.o. female who complains of low back pain for 3 day(s), positional with bending or lifting, without radiation down the legs. Precipitating factors: none recalled by the patient. Prior history of back problems: no prior back problems. There is not numbness in the legs.   Musculoskeletal ROS: positive for - muscle pain and swelling in back - right negative for - gait disturbance, joint swelling or muscular weakness    OBJECTIVE: BP (!) 131/92 (BP Location: Right Arm, Patient Position: Sitting, Cuff Size: Large)   Pulse 75   Temp 98 F (36.7 C) (Temporal)   Ht 5\' 11"  (1.803 m)   Wt 236 lb 12.8 oz (107.4 kg)   LMP 11/01/2019   SpO2 99%   BMI 33.03 kg/m   Patient appears to be in mild to moderate pain, antalgic gait noted. Lumbosacral spine area reveals no local tenderness or mass.  Painful and reduced LS ROM noted. Straight leg raise is negative.  Mid trapezius on the right side is positive for paraspinal muscle spasm with some swelling.  DTR's, motor strength and sensation normal, including heel and toe gait.  Peripheral pulses are palpable. X-Ray: not indicated.  ASSESSMENT:  lumbar strain  PLAN: For acute pain, rest, intermittent application of heat (do not sleep on heating pad), analgesics and muscle relaxants are recommended. Discussed longer term treatment plan of prn NSAID's and discussed a home back care exercise program with flexion exercise routine. Proper lifting with avoidance of heavy lifting discussed. Consider Physical Therapy and XRay studies if not improving. Call or return to clinic prn if these symptoms worsen or fail to improve as anticipated.

## 2019-12-31 ENCOUNTER — Other Ambulatory Visit: Payer: Self-pay

## 2019-12-31 ENCOUNTER — Ambulatory Visit (INDEPENDENT_AMBULATORY_CARE_PROVIDER_SITE_OTHER): Payer: 59 | Admitting: Family Medicine

## 2019-12-31 ENCOUNTER — Encounter: Payer: Self-pay | Admitting: Family Medicine

## 2019-12-31 VITALS — BP 135/89 | HR 83 | Temp 97.9°F | Ht 71.0 in | Wt 241.0 lb

## 2019-12-31 DIAGNOSIS — Z6833 Body mass index (BMI) 33.0-33.9, adult: Secondary | ICD-10-CM

## 2019-12-31 DIAGNOSIS — I10 Essential (primary) hypertension: Secondary | ICD-10-CM

## 2019-12-31 DIAGNOSIS — M25571 Pain in right ankle and joints of right foot: Secondary | ICD-10-CM | POA: Diagnosis not present

## 2019-12-31 MED ORDER — LISINOPRIL-HYDROCHLOROTHIAZIDE 10-12.5 MG PO TABS: 1.0000 | ORAL_TABLET | Freq: Every day | ORAL | 2 refills | Status: DC

## 2019-12-31 NOTE — Patient Instructions (Addendum)
No change in medications for now.  Restart low intensity exercise such as walking most days per week with an ultimate goal of 150 minutes/week.    See information below for ankle home exercises.  Recheck in the next 2 to 3 weeks if symptoms  not improving, sooner if worse.  Otherwise 6 months for hypertension.  Thank you for coming in today.     Ankle Exercises Ask your health care provider which exercises are safe for you. Do exercises exactly as told by your health care provider and adjust them as directed. It is normal to feel mild stretching, pulling, tightness, or mild discomfort as you do these exercises. Stop right away if you feel sudden pain or your pain gets worse. Do not begin these exercises until told by your health care provider. Stretching and range-of-motion exercises These exercises warm up your muscles and joints and improve the movement and flexibility of your ankle. These exercises may also help to relieve pain. Dorsiflexion/plantar flexion  1. Sit with your __________ knee straight or bent. Do not rest your foot on anything. 2. Flex your __________ ankle to tilt the top of your foot toward your shin. This is called dorsiflexion. 3. Hold this position for __________ seconds. 4. Point your toes downward to tilt the top of your foot away from your shin. This is called plantar flexion. 5. Hold this position for __________ seconds. Repeat __________ times. Complete this exercise __________ times a day. Ankle alphabet  1. Sit with your __________ foot supported at your lower leg. ? Do not rest your foot on anything. ? Make sure your foot has room to move freely. 2. Think of your __________ foot as a paintbrush: ? Move your foot to trace each letter of the alphabet in the air. Keep your hip and knee still while you trace the letters. Trace every letter from A to Z. ? Make the letters as large as you can without causing or increasing any discomfort. Repeat __________ times.  Complete this exercise __________ times a day. Passive ankle dorsiflexion This is an exercise in which something or someone moves your ankle for you. You do not move it yourself. 1. Sit on a chair that is placed on a non-carpeted surface. 2. Place your __________ foot on the floor, directly under your __________ knee. Extend your __________ leg for support. 3. Keeping your heel down, slide your __________ foot back toward the chair until you feel a stretch at your ankle or calf. If you do not feel a stretch, slide your buttocks forward to the edge of the chair while keeping your heel down. 4. Hold this stretch for __________ seconds. Repeat __________ times. Complete this exercise __________ times a day. Strengthening exercises These exercises build strength and endurance in your ankle. Endurance is the ability to use your muscles for a long time, even after they get tired. Dorsiflexors These are muscles that lift your foot up. 1. Secure a rubber exercise band or tube to an object, such as a table leg, that will stay still when the band is pulled. Secure the other end around your __________ foot. 2. Sit on the floor, facing the object with your __________ leg extended. The band or tube should be slightly tense when your foot is relaxed. 3. Slowly flex your __________ ankle and toes to bring your foot toward your shin. 4. Hold this position for __________ seconds. 5. Slowly return your foot to the starting position, controlling the band as you do that.  Repeat __________ times. Complete this exercise __________ times a day. Plantar flexors These are muscles that push your foot down. 1. Sit on the floor with your __________ leg extended. 2. Loop a rubber exercise band or tube around the ball of your __________ foot. The ball of your foot is on the walking surface, right under your toes. The band or tube should be slightly tense when your foot is relaxed. 3. Slowly point your toes downward, pushing  them away from you. 4. Hold this position for __________ seconds. 5. Slowly release the tension in the band or tube, controlling smoothly until your foot is back in the starting position. Repeat __________ times. Complete this exercise __________ times a day. Towel curls  1. Sit in a chair on a non-carpeted surface, and put your feet on the floor. 2. Place a towel in front of your feet. If told by your health care provider, add a __________ pound weight to the end of the towel. 3. Keeping your heel on the floor, put your __________ foot on the towel. 4. Pull the towel toward you by grabbing the towel with your toes and curling them under. Keep your heel on the floor. 5. Let your toes relax. 6. Grab the towel again. Keep pulling the towel until it is completely underneath your foot. Repeat __________ times. Complete this exercise __________ times a day. Standing plantar flexion This is an exercise in which you use your toes to lift your body's weight while standing. 1. Stand with your feet shoulder-width apart. 2. Keep your weight spread evenly over the width of your feet while you rise up on your toes. Use a wall or table to steady yourself if needed, but try not to use it for support. 3. If this exercise is too easy, try these options: ? Shift your weight toward your __________ leg until you feel challenged. ? If told by your health care provider, lift your uninjured leg off the floor. 4. Hold this position for __________ seconds. Repeat __________ times. Complete this exercise __________ times a day. Tandem walking 1. Stand with one foot directly in front of the other. 2. Slowly raise your back foot up, lifting your heel before your toes, and place it directly in front of your other foot. 3. Continue to walk in this heel-to-toe way for __________ or for as long as told by your health care provider. Have a countertop or wall nearby to use if needed to keep your balance, but try not to hold  onto anything for support. Repeat __________ times. Complete this exercise __________ times a day. This information is not intended to replace advice given to you by your health care provider. Make sure you discuss any questions you have with your health care provider. Document Revised: 06/27/2018 Document Reviewed: 06/29/2018 Elsevier Patient Education  [employer].   If you have lab work done today you will be contacted with your lab results within the next 2 weeks.  If you have not heard from [place] then please contact us. The fastest way to get your results is to register for My Chart.   IF you received an x-ray today, you will receive an invoice from [facility]. Please contact [facility] at [phone] with questions or concerns regarding your invoice.   IF you received labwork today, you will receive an invoice from [facility]. Please contact [facility] at [phone] with questions or concerns regarding your invoice.   Our billing staff will not be able to assist  you with questions regarding bills from these companies.  You will be contacted with the lab results as soon as they are available. The fastest way to get your results is to activate your My Chart account. Instructions are located on the last page of this paperwork. If you have not heard from [place] regarding the results in 2 weeks, please contact this office.

## 2019-12-31 NOTE — Progress Notes (Signed)
Subjective:  Patient ID: Jessica Moreno, female    DOB: 24-Jun-1976  Age: 44 y.o. MRN: [id number]  CC:  Chief Complaint  Patient presents with  . Hypertension    no issues with BP since last vist. pt checks BP once a week. BP has ben running around 130/90. pt states no physical symptoms of hypertension.  Marland Kitchen ankel pain    pt states she has som pain in her R ankel. pt resently triped at work, and ever since if she steps or positions her foot wrong she gets a sharp pain that shoots across her ankel. 6/10 pain level. started about 2 weeks ago.     HPI Jessica Moreno presents for    Hypertension: Lisinopril HCTZ 10/12.5 mg daily. No new side effects.  Home readings:130/90. BP Readings from Last 3 Encounters:  12/31/19 135/89  11/26/19 (!) 131/92  07/01/19 131/84   Lab Results  Component Value Date   CREATININE 1.11 (H) 07/01/2019   Obesity: Body mass index is 33.61 kg/m. No regular exercise. Started walking again with improved weather.  Has tried to cut back on fast food.  No sweetened beverages.    Wt Readings from Last 3 Encounters:  12/31/19 241 lb (109.3 kg)  11/26/19 236 lb 12.8 oz (107.4 kg)  07/01/19 241 lb (109.3 kg)   R ankle pain Tripped and turned about 2 weeks ago. No initial pain, but some sharp pain at times with certain movements, or with prolonged walking. No swelling/bruising. Walks without assistance. No meds. Short lasting pain - brief discomfort.   History Patient Active Problem List   Diagnosis Date Noted  . Back pain 11/26/2019  . Need for prophylactic vaccination and inoculation against influenza 11/26/2019  . Heart murmur 12/28/2016  . Heart murmur, systolic 71/03/2693  . Obesity (BMI 30.0-34.9) 01/12/2015  . Family history of hypertension 01/12/2015  . Family history of cardiovascular disease 01/12/2015  . HTN (hypertension) 02/12/2014   Past Medical History:  Diagnosis Date  . Asthma   . Hypertension    Past Surgical History:    Procedure Laterality Date  . D & C     Allergies  Allergen Reactions  . Penicillins Other (See Comments)    Lesions Has patient had a PCN reaction causing immediate rash, facial/tongue/throat swelling, SOB or lightheadedness with hypotension: No Has patient had a PCN reaction causing severe rash involving mucus membranes or skin necrosis: No Has patient had a PCN reaction that required hospitalization: No Has patient had a PCN reaction occurring within the last 10 years: No If all of the above answers are "NO", then may proceed with Cephalosporin use.    Prior to Admission medications   Medication Sig Start Date End Date Taking? Authorizing Provider  albuterol (VENTOLIN HFA) 108 (90 Base) MCG/ACT inhaler Inhale 1-2 puffs into the lungs every 4 (four) hours as needed for wheezing or shortness of breath (cough, shortness of breath or wheezing.). 07/01/19  Yes Wendie Agreste, MD  lisinopril-hydrochlorothiazide (ZESTORETIC) 10-12.5 MG tablet Take 1 tablet by mouth daily. 07/01/19  Yes Wendie Agreste, MD  methylPREDNISolone (MEDROL DOSEPAK) 4 MG TBPK tablet As directed on pkg label 11/26/19  Yes Wendall Mola, NP   Social History   Socioeconomic History  . Marital status: Single    Spouse name: Not on file  . Number of children: 1  . Years of education: Not on file  . Highest education level: Not on file  Occupational History  . Not on file  Tobacco Use  . Smoking status: Never Smoker  . Smokeless tobacco: Never Used  Substance and Sexual Activity  . Alcohol use: Yes  . Drug use: No  . Sexual activity: Yes    Birth control/protection: Condom  Other Topics Concern  . Not on file  Social History Narrative  . Not on file   Social Determinants of Health   Financial Resource Strain:   . Difficulty of Paying Living Expenses:   Food Insecurity:   . Worried About Programme researcher, broadcasting/film/video in the Last Year:   . Barista in the Last Year:   Transportation Needs:   .  Freight forwarder (Medical):   Marland Kitchen Lack of Transportation (Non-Medical):   Physical Activity:   . Days of Exercise per Week:   . Minutes of Exercise per Session:   Stress:   . Feeling of Stress :   Social Connections:   . Frequency of Communication with Friends and Family:   . Frequency of Social Gatherings with Friends and Family:   . Attends Religious Services:   . Active Member of Clubs or Organizations:   . Attends Banker Meetings:   Marland Kitchen Marital Status:   Intimate Partner Violence:   . Fear of Current or Ex-Partner:   . Emotionally Abused:   Marland Kitchen Physically Abused:   . Sexually Abused:     Review of Systems  Constitutional: Negative for fatigue and unexpected weight change.  Respiratory: Negative for chest tightness and shortness of breath.   Cardiovascular: Negative for chest pain, palpitations and leg swelling.  Gastrointestinal: Negative for abdominal pain and blood in stool.  Neurological: Negative for dizziness, syncope, light-headedness and headaches.     Objective:   Vitals:   12/31/19 0815  BP: 135/89  Pulse: 83  Temp: 97.9 F (36.6 C)  TempSrc: Temporal  SpO2: 96%  Weight: 241 lb (109.3 kg)  Height: 5\' 11"  (1.803 m)     Physical Exam Vitals reviewed.  Constitutional:      Appearance: She is well-developed.  HENT:     Head: Normocephalic and atraumatic.  Eyes:     Conjunctiva/sclera: Conjunctivae normal.     Pupils: Pupils are equal, round, and reactive to light.  Neck:     Vascular: No carotid bruit.  Cardiovascular:     Rate and Rhythm: Normal rate and regular rhythm.     Heart sounds: Normal heart sounds.  Pulmonary:     Effort: Pulmonary effort is normal.     Breath sounds: Normal breath sounds.  Abdominal:     Palpations: Abdomen is soft. There is no pulsatile mass.     Tenderness: There is no abdominal tenderness.  Musculoskeletal:     Right ankle: No swelling, deformity or ecchymosis. No tenderness (Malleoli, navicular,  fifth metatarsal nontender.  Negative drawer, Klieger,  talar tilt.  No tenderness.  Full strength.). Normal range of motion. Normal pulse.     Right Achilles Tendon: Normal. No tenderness or defects.  Skin:    General: Skin is warm and dry.  Neurological:     Mental Status: She is alert and oriented to person, place, and time.  Psychiatric:        Behavior: Behavior normal.     Assessment & Plan:  Jessica Moreno is a 44 y.o. female . Essential hypertension - Plan: Basic metabolic panel  -Stable, continue lisinopril HCTZ same dose.  Check labs.  Acute right ankle pain  -Possible deconditioning, reassuring exam. home exercise  regimen initially, Thera-Band given.  Recheck next 3 weeks if not improving, sooner if worse. Imaging deferred at present.   BMI 33.0-33.9,adult  -Increasing low intensity exercise recommended.  Lipid screening, glucose normal last year, check glucose again on basic metabolic panel  No orders of the defined types were placed in this encounter.  There are no Patient Instructions on file for this visit.    Signed, Meredith Staggers, MD [facility]

## 2020-01-01 LAB — BASIC METABOLIC PANEL
BUN/Creatinine Ratio: 18 (ref 9–23)
BUN: 16 mg/dL (ref 6–24)
CO2: 23 mmol/L (ref 20–29)
Calcium: 9.5 mg/dL (ref 8.7–10.2)
Chloride: 104 mmol/L (ref 96–106)
Creatinine, Ser: 0.9 mg/dL (ref 0.57–1.00)
GFR calc Af Amer: 89 mL/min/{1.73_m2} (ref >59)
GFR calc non Af Amer: 77 mL/min/{1.73_m2} (ref >59)
Glucose: 84 mg/dL (ref 65–99)
Potassium: 3.8 mmol/L (ref 3.5–5.2)
Sodium: 141 mmol/L (ref 134–144)

## 2020-01-18 LAB — HM MAMMOGRAPHY

## 2020-06-30 ENCOUNTER — Other Ambulatory Visit: Payer: Self-pay

## 2020-06-30 ENCOUNTER — Encounter: Payer: Self-pay | Admitting: Family Medicine

## 2020-06-30 ENCOUNTER — Ambulatory Visit (INDEPENDENT_AMBULATORY_CARE_PROVIDER_SITE_OTHER): Payer: 59 | Admitting: Family Medicine

## 2020-06-30 VITALS — BP 124/82 | HR 76 | Temp 98.0°F | Ht 71.0 in | Wt 246.0 lb

## 2020-06-30 DIAGNOSIS — Z7185 Encounter for immunization safety counseling: Secondary | ICD-10-CM

## 2020-06-30 DIAGNOSIS — Z7189 Other specified counseling: Secondary | ICD-10-CM

## 2020-06-30 DIAGNOSIS — Z6834 Body mass index (BMI) 34.0-34.9, adult: Secondary | ICD-10-CM | POA: Diagnosis not present

## 2020-06-30 DIAGNOSIS — J4520 Mild intermittent asthma, uncomplicated: Secondary | ICD-10-CM | POA: Diagnosis not present

## 2020-06-30 DIAGNOSIS — I10 Essential (primary) hypertension: Secondary | ICD-10-CM | POA: Diagnosis not present

## 2020-06-30 DIAGNOSIS — Z131 Encounter for screening for diabetes mellitus: Secondary | ICD-10-CM

## 2020-06-30 DIAGNOSIS — Z13220 Encounter for screening for lipoid disorders: Secondary | ICD-10-CM

## 2020-06-30 MED ORDER — LISINOPRIL-HYDROCHLOROTHIAZIDE 10-12.5 MG PO TABS: 1.0000 | ORAL_TABLET | Freq: Every day | ORAL | 2 refills | Status: AC

## 2020-06-30 NOTE — Patient Instructions (Addendum)
Some form of exercise most days per week - goal of 150 min per week.   Start with 2 days of exercise in next week as initial goal. If ankle pain returns, follow up.  No change in meds at this time.   [email or web address]   If you have lab work done today you will be contacted with your lab results within the next 2 weeks.  If you have not heard from [place] then please contact us. The fastest way to get your results is to register for My Chart.   IF you received an x-ray today, you will receive an invoice from [facility]. Please contact [facility] at [phone] with questions or concerns regarding your invoice.   IF you received labwork today, you will receive an invoice from [facility]. Please contact [facility] at [phone] with questions or concerns regarding your invoice.   Our billing staff will not be able to assist you with questions regarding bills from these companies.  You will be contacted with the lab results as soon as they are available. The fastest way to get your results is to activate your My Chart account. Instructions are located on the last page of this paperwork. If you have not heard from [place] regarding the results in 2 weeks, please contact this office.

## 2020-06-30 NOTE — Progress Notes (Signed)
Subjective:  Patient ID: Jessica Moreno, female    DOB: 09/30/1976  Age: 44 y.o. MRN: [id number]  CC:  Chief Complaint  Patient presents with  . Follow-up    on on hypertension and L ankle pain. Pt reports no issues with BP. Pt checks her BP only when she dosen't  feel good. pt states she hasn't had to check it since last OV.PT reports no physical symptoms of hypertension since last OV. PT reports her ankle is feeling better since last OV. pt reports no pain or tenderness.    HPI Jessica Moreno presents for  Hypertension:  Treated with lisinopril hydrochlorothiazide 10/12.5 mg daily. Home readings: None recently-  feels fine.  BP Readings from Last 3 Encounters:  06/30/20 124/82  12/31/19 135/89  11/26/19 (!) 131/92   Lab Results  Component Value Date   CREATININE 0.90 12/31/2019   Obesity: Body mass index is 34.31 kg/m. BMI was 33.6 March 19 visit, up 5 pounds since that time.  She had started walking again with improved weather at that time and was trying to cut back on fast food, no sweetened beverages. No regular exercise recently. Misses classes. Looking into app/online options.   Wt Readings from Last 3 Encounters:  06/30/20 246 lb (111.6 kg)  12/31/19 241 lb (109.3 kg)  11/26/19 236 lb 12.8 oz (107.4 kg)   Right ankle pain: Discussed at March visit, possible sprain with deconditioning.  Home exercise program and stretching was discussed at that time.  Has felt better since then, with no limitations. No regular exercise.   Asthma: Mild intermittent.  Albuterol only if needed. Used 2 weeks ago, prior had been months.   Has not received covid vaccine yet. Still considering. Discussed concerns and questions.    History Patient Active Problem List   Diagnosis Date Noted  . Back pain 11/26/2019  . Need for prophylactic vaccination and inoculation against influenza 11/26/2019  . Heart murmur 12/28/2016  . Heart murmur, systolic 12/28/2016  . Obesity (BMI  30.0-34.9) 01/12/2015  . Family history of hypertension 01/12/2015  . Family history of cardiovascular disease 01/12/2015  . HTN (hypertension) 02/12/2014   Past Medical History:  Diagnosis Date  . Asthma   . Hypertension    Past Surgical History:  Procedure Laterality Date  . D & C     Allergies  Allergen Reactions  . Penicillins Other (See Comments)    Lesions Has patient had a PCN reaction causing immediate rash, facial/tongue/throat swelling, SOB or lightheadedness with hypotension: No Has patient had a PCN reaction causing severe rash involving mucus membranes or skin necrosis: No Has patient had a PCN reaction that required hospitalization: No Has patient had a PCN reaction occurring within the last 10 years: No If all of the above answers are "NO", then may proceed with Cephalosporin use.    Prior to Admission medications   Medication Sig Start Date End Date Taking? Authorizing Provider  albuterol (VENTOLIN HFA) 108 (90 Base) MCG/ACT inhaler Inhale 1-2 puffs into the lungs every 4 (four) hours as needed for wheezing or shortness of breath (cough, shortness of breath or wheezing.). 07/01/19  Yes Shade Flood, MD  lisinopril-hydrochlorothiazide (ZESTORETIC) 10-12.5 MG tablet Take 1 tablet by mouth daily. 12/31/19  Yes Shade Flood, MD   Social History   Socioeconomic History  . Marital status: Single    Spouse name: Not on file  . Number of children: 1  . Years of education: Not on file  . Highest  education level: Not on file  Occupational History  . Not on file  Tobacco Use  . Smoking status: Never Smoker  . Smokeless tobacco: Never Used  Vaping Use  . Vaping Use: Never used  Substance and Sexual Activity  . Alcohol use: Yes  . Drug use: No  . Sexual activity: Yes    Birth control/protection: Condom  Other Topics Concern  . Not on file  Social History Narrative  . Not on file   Social Determinants of Health   Financial Resource Strain:   .  Difficulty of Paying Living Expenses: Not on file  Food Insecurity:   . Worried About Programme researcher, broadcasting/film/video in the Last Year: Not on file  . Ran Out of Food in the Last Year: Not on file  Transportation Needs:   . Lack of Transportation (Medical): Not on file  . Lack of Transportation (Non-Medical): Not on file  Physical Activity:   . Days of Exercise per Week: Not on file  . Minutes of Exercise per Session: Not on file  Stress:   . Feeling of Stress : Not on file  Social Connections:   . Frequency of Communication with Friends and Family: Not on file  . Frequency of Social Gatherings with Friends and Family: Not on file  . Attends Religious Services: Not on file  . Active Member of Clubs or Organizations: Not on file  . Attends Banker Meetings: Not on file  . Marital Status: Not on file  Intimate Partner Violence:   . Fear of Current or Ex-Partner: Not on file  . Emotionally Abused: Not on file  . Physically Abused: Not on file  . Sexually Abused: Not on file    Review of Systems  Constitutional: Negative for fatigue and unexpected weight change.  Respiratory: Negative for chest tightness and shortness of breath.   Cardiovascular: Negative for chest pain, palpitations and leg swelling.  Gastrointestinal: Negative for abdominal pain and blood in stool.  Neurological: Negative for dizziness, syncope, light-headedness and headaches.     Objective:   Vitals:   06/30/20 0826 06/30/20 0830  BP: (!) 142/85 124/82  Pulse: 76   Temp: 98 F (36.7 C)   TempSrc: Temporal   SpO2: 95%   Weight: 246 lb (111.6 kg)   Height: 5\' 11"  (1.803 m)      Physical Exam Vitals reviewed.  Constitutional:      Appearance: She is well-developed.  HENT:     Head: Normocephalic and atraumatic.  Eyes:     Conjunctiva/sclera: Conjunctivae normal.     Pupils: Pupils are equal, round, and reactive to light.  Neck:     Vascular: No carotid bruit.  Cardiovascular:     Rate and  Rhythm: Normal rate and regular rhythm.     Heart sounds: Normal heart sounds.  Pulmonary:     Effort: Pulmonary effort is normal.     Breath sounds: Normal breath sounds.  Abdominal:     Palpations: Abdomen is soft. There is no pulsatile mass.     Tenderness: There is no abdominal tenderness.  Skin:    General: Skin is warm and dry.  Neurological:     Mental Status: She is alert and oriented to person, place, and time.  Psychiatric:        Behavior: Behavior normal.     34 minutes spent during visit, greater than 50% counseling and assimilation of information, chart review, and discussion of plan.   Assessment &  Plan:  Jessica Moreno is a 44 y.o. female . Essential hypertension - Plan: Comprehensive metabolic panel, Lipid panel, lisinopril-hydrochlorothiazide (ZESTORETIC) 10-12.5 MG tablet  -  Stable, tolerating current regimen. Medications refilled. Labs pending as above.   -Exercise discussed.  RTC precautions if recurrence of ankle pain.  Mild intermittent asthma without complication  -Stable control with rare use of albuterol.  Can refill when needed. BMI 34.0-34.9,adult - Plan: Lipid panel, Hemoglobin A1c  Vaccine counseling  -COVID-19 vaccine was recommended and discussed her concerns/questions.  Advised to let me know if I can provide any further information.  Screening for diabetes mellitus - Plan: Hemoglobin A1c  Screening for hyperlipidemia - Plan: Lipid panel   Meds ordered this encounter  Medications  . lisinopril-hydrochlorothiazide (ZESTORETIC) 10-12.5 MG tablet    Sig: Take 1 tablet by mouth daily.    Dispense:  90 tablet    Refill:  2   Patient Instructions   Some form of exercise most days per week - goal of 150 min per week.   Start with 2 days of exercise in next week as initial goal. If ankle pain returns, follow up.  No change in meds at this time.   [email or web address]   If you have lab work done today you will be  contacted with your lab results within the next 2 weeks.  If you have not heard from [place] then please contact us. The fastest way to get your results is to register for My Chart.   IF you received an x-ray today, you will receive an invoice from [facility]. Please contact [facility] at [phone] with questions or concerns regarding your invoice.   IF you received labwork today, you will receive an invoice from [facility]. Please contact [facility] at [phone] with questions or concerns regarding your invoice.   Our billing staff will not be able to assist you with questions regarding bills from these companies.  You will be contacted with the lab results as soon as they are available. The fastest way to get your results is to activate your My Chart account. Instructions are located on the last page of this paperwork. If you have not heard from [place] regarding the results in 2 weeks, please contact this office.         Signed, Meredith Staggers, MD [facility]

## 2020-07-01 ENCOUNTER — Encounter: Payer: Self-pay | Admitting: Family Medicine

## 2020-07-01 LAB — COMPREHENSIVE METABOLIC PANEL
ALT: 12 IU/L (ref 0–32)
AST: 20 IU/L (ref 0–40)
Albumin/Globulin Ratio: 1.5 (ref 1.2–2.2)
Albumin: 4.2 g/dL (ref 3.8–4.8)
Alkaline Phosphatase: 59 IU/L (ref 44–121)
BUN/Creatinine Ratio: 15 (ref 9–23)
BUN: 14 mg/dL (ref 6–24)
Bilirubin Total: 0.5 mg/dL (ref 0.0–1.2)
CO2: 26 mmol/L (ref 20–29)
Calcium: 9.3 mg/dL (ref 8.7–10.2)
Chloride: 104 mmol/L (ref 96–106)
Creatinine, Ser: 0.94 mg/dL (ref 0.57–1.00)
GFR calc Af Amer: 84 mL/min/{1.73_m2} (ref >59)
GFR calc non Af Amer: 73 mL/min/{1.73_m2} (ref >59)
Globulin, Total: 2.8 g/dL (ref 1.5–4.5)
Glucose: 89 mg/dL (ref 65–99)
Potassium: 3.9 mmol/L (ref 3.5–5.2)
Sodium: 141 mmol/L (ref 134–144)
Total Protein: 7 g/dL (ref 6.0–8.5)

## 2020-07-01 LAB — LIPID PANEL
Chol/HDL Ratio: 1.6 ratio (ref 0.0–4.4)
Cholesterol, Total: 136 mg/dL (ref 100–199)
HDL: 84 mg/dL (ref >39)
LDL Chol Calc (NIH): 40 mg/dL (ref 0–99)
Triglycerides: 53 mg/dL (ref 0–149)
VLDL Cholesterol Cal: 12 mg/dL (ref 5–40)

## 2020-07-01 LAB — HEMOGLOBIN A1C
Est. average glucose Bld gHb Est-mCnc: 103 mg/dL
Hgb A1c MFr Bld: 5.2 % (ref 4.8–5.6)

## 2020-12-28 ENCOUNTER — Ambulatory Visit: Payer: 59 | Admitting: Family Medicine

## 2022-04-06 ENCOUNTER — Encounter (HOSPITAL_BASED_OUTPATIENT_CLINIC_OR_DEPARTMENT_OTHER): Payer: Self-pay | Admitting: Emergency Medicine

## 2022-04-06 ENCOUNTER — Emergency Department (HOSPITAL_BASED_OUTPATIENT_CLINIC_OR_DEPARTMENT_OTHER)
Admission: EM | Admit: 2022-04-06 | Discharge: 2022-04-06 | Disposition: A | Discharge status: Home / Self Care | Payer: BC Managed Care – PPO | Attending: Emergency Medicine | Admitting: Emergency Medicine

## 2022-04-06 ENCOUNTER — Other Ambulatory Visit: Payer: Self-pay

## 2022-04-06 DIAGNOSIS — M79602 Pain in left arm: Secondary | ICD-10-CM | POA: Insufficient documentation

## 2022-04-06 DIAGNOSIS — S20319A Abrasion of unspecified front wall of thorax, initial encounter: Secondary | ICD-10-CM | POA: Diagnosis not present

## 2022-04-06 DIAGNOSIS — Y92410 Unspecified street and highway as the place of occurrence of the external cause: Secondary | ICD-10-CM | POA: Insufficient documentation

## 2022-04-06 DIAGNOSIS — M79652 Pain in left thigh: Secondary | ICD-10-CM | POA: Diagnosis not present

## 2022-04-06 DIAGNOSIS — M79605 Pain in left leg: Secondary | ICD-10-CM | POA: Diagnosis not present

## 2022-04-06 DIAGNOSIS — R0789 Other chest pain: Secondary | ICD-10-CM | POA: Diagnosis present

## 2022-04-06 MED ORDER — METHOCARBAMOL 500 MG PO TABS
500.0000 mg | ORAL_TABLET | Freq: Once | ORAL | Status: AC
  Administered 2022-04-06: 500 mg via ORAL
  Filled 2022-04-06: qty 1

## 2022-04-06 MED ORDER — METHOCARBAMOL 500 MG PO TABS: 500.0000 mg | ORAL_TABLET | Freq: Two times a day (BID) | ORAL | 0 refills | Status: AC

## 2023-11-19 ENCOUNTER — Ambulatory Visit: Payer: BC Managed Care – PPO | Admitting: Podiatry

## 2023-12-10 ENCOUNTER — Ambulatory Visit (INDEPENDENT_AMBULATORY_CARE_PROVIDER_SITE_OTHER): Payer: BC Managed Care – PPO | Admitting: Podiatry

## 2023-12-10 DIAGNOSIS — M722 Plantar fascial fibromatosis: Secondary | ICD-10-CM | POA: Diagnosis not present

## 2023-12-10 NOTE — Progress Notes (Unsigned)
  Subjective:  Patient ID: Jessica Moreno, female    DOB: 03-30-1976,  MRN: [id number]  Chief Complaint  Patient presents with   Heel Spurs    48 y.o. female presents with the above complaint.  Patient presents with right heel pain that has been going for quite some time hurts with taking for step in the morning hurts with ambulation pain scale is 5 out of 10 dull aching nature hurts with taking pressure.  Feels like burning sharp shooting pain.  She has not seen anyone else prior to seeing me denies any other acute complaints   Review of Systems: Negative except as noted in the HPI. Denies N/V/F/Ch.  Past Medical History:  Diagnosis Date   Asthma    Hypertension     Current Outpatient Medications:    albuterol (VENTOLIN HFA) 108 (90 Base) MCG/ACT inhaler, Inhale 1-2 puffs into the lungs every 4 (four) hours as needed for wheezing or shortness of breath (cough, shortness of breath or wheezing.)., Disp: 18 g, Rfl: 1   lisinopril-hydrochlorothiazide (ZESTORETIC) 10-12.5 MG tablet, Take 1 tablet by mouth daily., Disp: 90 tablet, Rfl: 2   methocarbamol (ROBAXIN) 500 MG tablet, Take 1 tablet (500 mg total) by mouth 2 (two) times daily., Disp: 20 tablet, Rfl: 0  Social History   Tobacco Use  Smoking Status Never  Smokeless Tobacco Never    Allergies  Allergen Reactions   Penicillins Other (See Comments)    Lesions Has patient had a PCN reaction causing immediate rash, facial/tongue/throat swelling, SOB or lightheadedness with hypotension: No Has patient had a PCN reaction causing severe rash involving mucus membranes or skin necrosis: No Has patient had a PCN reaction that required hospitalization: No Has patient had a PCN reaction occurring within the last 10 years: No If all of the above answers are "NO", then may proceed with Cephalosporin use.    Objective:  There were no vitals filed for this visit. There is no height or weight on file to calculate BMI. Constitutional  Well developed. Well nourished.  Vascular Dorsalis pedis pulses palpable bilaterally. Posterior tibial pulses palpable bilaterally. Capillary refill normal to all digits.  No cyanosis or clubbing noted. Pedal hair growth normal.  Neurologic Normal speech. Oriented to person, place, and time. Epicritic sensation to light touch grossly present bilaterally.  Dermatologic Nails well groomed and normal in appearance. No open wounds. No skin lesions.  Orthopedic: Normal joint ROM without pain or crepitus bilaterally. No visible deformities. Tender to palpation at the calcaneal tuber right. No pain with calcaneal squeeze right. Ankle ROM diminished range of motion right. Silfverskiold Test: positive right.   Radiographs: None  Assessment:   1. Plantar fasciitis, right    Plan:  Patient was evaluated and treated and all questions answered.  Plantar Fasciitis, right - XR reviewed as above.  - Educated on icing and stretching. Instructions given.  - Injection delivered to the plantar fascia as below. - DME: Plantar fascial brace dispensed to support the medial longitudinal arch of the foot and offload pressure from the heel and prevent arch collapse during weightbearing - Pharmacologic management: None  Procedure: Injection Tendon/Ligament Location: Right plantar fascia at the glabrous junction; medial approach. Skin Prep: alcohol Injectate: 0.5 cc 0.5% marcaine plain, 0.5 cc of 1% Lidocaine, 0.5 cc kenalog 10. Disposition: Patient tolerated procedure well. Injection site dressed with a band-aid.  No follow-ups on file.

## 2023-12-24 ENCOUNTER — Other Ambulatory Visit (HOSPITAL_COMMUNITY): Payer: Self-pay | Admitting: Internal Medicine

## 2023-12-24 ENCOUNTER — Ambulatory Visit (HOSPITAL_COMMUNITY)
Admission: RE | Admit: 2023-12-24 | Discharge: 2023-12-24 | Disposition: A | Discharge status: Home / Self Care | Source: Ambulatory Visit | Attending: Vascular Surgery | Admitting: Vascular Surgery

## 2023-12-24 DIAGNOSIS — M79662 Pain in left lower leg: Secondary | ICD-10-CM

## 2023-12-24 DIAGNOSIS — M79669 Pain in unspecified lower leg: Secondary | ICD-10-CM | POA: Insufficient documentation

## 2024-01-07 ENCOUNTER — Ambulatory Visit (INDEPENDENT_AMBULATORY_CARE_PROVIDER_SITE_OTHER): Payer: BC Managed Care – PPO | Admitting: Podiatry

## 2024-01-07 DIAGNOSIS — M722 Plantar fascial fibromatosis: Secondary | ICD-10-CM | POA: Diagnosis not present

## 2024-01-07 DIAGNOSIS — Q666 Other congenital valgus deformities of feet: Secondary | ICD-10-CM | POA: Diagnosis not present

## 2024-01-07 NOTE — Progress Notes (Signed)
  Subjective:  Patient ID: Jessica Moreno, female    DOB: 04-Jul-1976,  MRN: [id number]  Chief Complaint  Patient presents with   Plantar Fasciitis    Pt stated that the injection helped a lot     48 y.o. female presents with the above complaint.  Patient presents with right plantar fasciitis she states is doing a lot better 100% resolved no further pain denies any other current would like to discuss prevention   Review of Systems: Negative except as noted in the HPI. Denies N/V/F/Ch.  Past Medical History:  Diagnosis Date   Asthma    Hypertension     Current Outpatient Medications:    meloxicam (MOBIC) 15 MG tablet, Take 15 mg by mouth daily., Disp: , Rfl:    albuterol (VENTOLIN HFA) 108 (90 Base) MCG/ACT inhaler, Inhale 1-2 puffs into the lungs every 4 (four) hours as needed for wheezing or shortness of breath (cough, shortness of breath or wheezing.)., Disp: 18 g, Rfl: 1   lisinopril-hydrochlorothiazide (ZESTORETIC) 10-12.5 MG tablet, Take 1 tablet by mouth daily., Disp: 90 tablet, Rfl: 2   methocarbamol (ROBAXIN) 500 MG tablet, Take 1 tablet (500 mg total) by mouth 2 (two) times daily., Disp: 20 tablet, Rfl: 0  Social History   Tobacco Use  Smoking Status Never  Smokeless Tobacco Never    Allergies  Allergen Reactions   Penicillins Other (See Comments)    Lesions Has patient had a PCN reaction causing immediate rash, facial/tongue/throat swelling, SOB or lightheadedness with hypotension: No Has patient had a PCN reaction causing severe rash involving mucus membranes or skin necrosis: No Has patient had a PCN reaction that required hospitalization: No Has patient had a PCN reaction occurring within the last 10 years: No If all of the above answers are "NO", then may proceed with Cephalosporin use.    Objective:  There were no vitals filed for this visit. There is no height or weight on file to calculate BMI. Constitutional Well developed. Well nourished.  Vascular  Dorsalis pedis pulses palpable bilaterally. Posterior tibial pulses palpable bilaterally. Capillary refill normal to all digits.  No cyanosis or clubbing noted. Pedal hair growth normal.  Neurologic Normal speech. Oriented to person, place, and time. Epicritic sensation to light touch grossly present bilaterally.  Dermatologic Nails well groomed and normal in appearance. No open wounds. No skin lesions.  Orthopedic: Normal joint ROM without pain or crepitus bilaterally. No visible deformities. No further tender to palpation at the calcaneal tuber right. No pain with calcaneal squeeze right. Ankle ROM diminished range of motion right. Silfverskiold Test: positive right.   Radiographs: None  Assessment:   No diagnosis found.  Plan:  Patient was evaluated and treated and all questions answered.  Plantar Fasciitis, right -Clinically healed with 1 injection.  Will hold off on any further injection at this time I discussed shoe gear modification as well as orthotics she states understanding like to proceed with orthotics  Pes planovalgus -I explained to patient the etiology of pes planovalgus and relationship with Planter fasciitis and various treatment options were discussed.  Given patient foot structure in the setting of Planter fasciitis I believe patient will benefit from custom-made orthotics to help control the hindfoot motion support the arch of the foot and take the stress away from plantar fascial.  Patient agrees with the plan like to proceed with orthotics -Patient was casted for orthotics   No follow-ups on file.

## 2024-01-13 NOTE — Progress Notes (Signed)
  Orthotic order placed will schedule for fitting when in

## 2024-02-06 ENCOUNTER — Telehealth: Payer: Self-pay | Admitting: Podiatry

## 2024-02-06 NOTE — Telephone Encounter (Signed)
 LVM to schedule an appt to PU orthotics

## 2024-02-23 ENCOUNTER — Other Ambulatory Visit

## 2024-03-05 ENCOUNTER — Ambulatory Visit

## 2024-03-05 NOTE — Progress Notes (Signed)
Patient presents today to pick up custom molded foot orthotics, diagnosed with PF by Dr. Allena Katz.   Orthotics were dispensed and fit was satisfactory. Reviewed instructions for break-in and wear. Written instructions given to patient.  Patient will follow up as needed.   Jessica Moreno Cped, CFo, CFm

## 2024-08-19 ENCOUNTER — Encounter (HOSPITAL_BASED_OUTPATIENT_CLINIC_OR_DEPARTMENT_OTHER): Payer: Self-pay

## 2024-08-19 ENCOUNTER — Other Ambulatory Visit (HOSPITAL_COMMUNITY): Payer: Self-pay | Admitting: Internal Medicine

## 2024-08-19 ENCOUNTER — Ambulatory Visit (HOSPITAL_BASED_OUTPATIENT_CLINIC_OR_DEPARTMENT_OTHER)
Admission: RE | Admit: 2024-08-19 | Discharge: 2024-08-19 | Disposition: A | Discharge status: Home / Self Care | Source: Ambulatory Visit | Attending: Internal Medicine | Admitting: Internal Medicine

## 2024-08-19 DIAGNOSIS — R1011 Right upper quadrant pain: Secondary | ICD-10-CM | POA: Diagnosis present

## 2024-08-19 DIAGNOSIS — R1013 Epigastric pain: Secondary | ICD-10-CM | POA: Diagnosis present

## 2024-08-19 MED ORDER — IOHEXOL 300 MG/ML  SOLN
100.0000 mL | Freq: Once | INTRAMUSCULAR | Status: AC | PRN
  Administered 2024-08-19: 100 mL via INTRAVENOUS

## 2024-09-23 ENCOUNTER — Other Ambulatory Visit: Payer: Self-pay

## 2024-09-23 DIAGNOSIS — R1011 Right upper quadrant pain: Secondary | ICD-10-CM

## 2024-09-23 DIAGNOSIS — R1010 Upper abdominal pain, unspecified: Secondary | ICD-10-CM

## 2024-09-23 DIAGNOSIS — R110 Nausea: Secondary | ICD-10-CM

## 2024-10-11 ENCOUNTER — Ambulatory Visit: Admission: RE | Admit: 2024-10-11 | Discharge: 2024-10-11 | Disposition: A | Source: Ambulatory Visit

## 2024-10-11 DIAGNOSIS — R1010 Upper abdominal pain, unspecified: Secondary | ICD-10-CM

## 2024-10-11 DIAGNOSIS — R110 Nausea: Secondary | ICD-10-CM

## 2024-10-11 DIAGNOSIS — R1011 Right upper quadrant pain: Secondary | ICD-10-CM
